# Patient Record
Sex: Male | Born: 1945 | Race: White | Hispanic: No | Smoking: Never smoker
Health system: Southern US, Community
[De-identification: ages and names within clinical notes are randomized; demographics above are authoritative.]

## PROBLEM LIST (undated history)

## (undated) DIAGNOSIS — D849 Immunodeficiency, unspecified: Secondary | ICD-10-CM

## (undated) DIAGNOSIS — M1990 Unspecified osteoarthritis, unspecified site: Secondary | ICD-10-CM

## (undated) DIAGNOSIS — S129XXA Fracture of neck, unspecified, initial encounter: Secondary | ICD-10-CM

## (undated) DIAGNOSIS — B20 Human immunodeficiency virus [HIV] disease: Secondary | ICD-10-CM

## (undated) DIAGNOSIS — Z21 Asymptomatic human immunodeficiency virus [HIV] infection status: Secondary | ICD-10-CM

## (undated) DIAGNOSIS — Z85828 Personal history of other malignant neoplasm of skin: Secondary | ICD-10-CM

## (undated) DIAGNOSIS — Z87442 Personal history of urinary calculi: Secondary | ICD-10-CM

## (undated) DIAGNOSIS — J189 Pneumonia, unspecified organism: Secondary | ICD-10-CM

## (undated) DIAGNOSIS — C4490 Unspecified malignant neoplasm of skin, unspecified: Secondary | ICD-10-CM

## (undated) DIAGNOSIS — Z801 Family history of malignant neoplasm of trachea, bronchus and lung: Secondary | ICD-10-CM

## (undated) DIAGNOSIS — I10 Essential (primary) hypertension: Secondary | ICD-10-CM

## (undated) DIAGNOSIS — C61 Malignant neoplasm of prostate: Secondary | ICD-10-CM

## (undated) DIAGNOSIS — Z800 Family history of malignant neoplasm of digestive organs: Secondary | ICD-10-CM

## (undated) DIAGNOSIS — T4145XA Adverse effect of unspecified anesthetic, initial encounter: Secondary | ICD-10-CM

## (undated) DIAGNOSIS — D649 Anemia, unspecified: Secondary | ICD-10-CM

## (undated) DIAGNOSIS — G589 Mononeuropathy, unspecified: Secondary | ICD-10-CM

## (undated) DIAGNOSIS — T8859XA Other complications of anesthesia, initial encounter: Secondary | ICD-10-CM

## (undated) DIAGNOSIS — C4491 Basal cell carcinoma of skin, unspecified: Secondary | ICD-10-CM

## (undated) HISTORY — PX: PROSTATE BIOPSY: SHX241

## (undated) HISTORY — PX: FACIAL COSMETIC SURGERY: SHX629

## (undated) HISTORY — DX: Personal history of other malignant neoplasm of skin: Z85.828

## (undated) HISTORY — DX: Family history of malignant neoplasm of trachea, bronchus and lung: Z80.1

## (undated) HISTORY — DX: Family history of malignant neoplasm of digestive organs: Z80.0

## (undated) HISTORY — PX: NOSE SURGERY: SHX723

## (undated) HISTORY — PX: CHOLECYSTECTOMY: SHX55

## (undated) HISTORY — PX: SPLENECTOMY: SUR1306

---
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
## 1979-09-04 DIAGNOSIS — K759 Inflammatory liver disease, unspecified: Secondary | ICD-10-CM

## 1979-09-04 HISTORY — DX: Inflammatory liver disease, unspecified: K75.9

## 2004-08-25 ENCOUNTER — Ambulatory Visit: Payer: Self-pay | Admitting: Infectious Diseases

## 2014-08-05 DIAGNOSIS — Z85828 Personal history of other malignant neoplasm of skin: Secondary | ICD-10-CM | POA: Insufficient documentation

## 2015-12-03 ENCOUNTER — Emergency Department (HOSPITAL_COMMUNITY): Payer: No Typology Code available for payment source

## 2015-12-03 ENCOUNTER — Encounter (HOSPITAL_COMMUNITY): Payer: Self-pay

## 2015-12-03 ENCOUNTER — Emergency Department (HOSPITAL_COMMUNITY)
Admission: EM | Admit: 2015-12-03 | Discharge: 2015-12-03 | Disposition: A | Discharge status: Home / Self Care | Payer: No Typology Code available for payment source | Attending: Emergency Medicine | Admitting: Emergency Medicine

## 2015-12-03 DIAGNOSIS — I10 Essential (primary) hypertension: Secondary | ICD-10-CM | POA: Insufficient documentation

## 2015-12-03 DIAGNOSIS — Z79899 Other long term (current) drug therapy: Secondary | ICD-10-CM | POA: Insufficient documentation

## 2015-12-03 DIAGNOSIS — Y9389 Activity, other specified: Secondary | ICD-10-CM | POA: Insufficient documentation

## 2015-12-03 DIAGNOSIS — T1490XA Injury, unspecified, initial encounter: Secondary | ICD-10-CM

## 2015-12-03 DIAGNOSIS — Y998 Other external cause status: Secondary | ICD-10-CM | POA: Diagnosis not present

## 2015-12-03 DIAGNOSIS — Y92410 Unspecified street and highway as the place of occurrence of the external cause: Secondary | ICD-10-CM | POA: Insufficient documentation

## 2015-12-03 DIAGNOSIS — B20 Human immunodeficiency virus [HIV] disease: Secondary | ICD-10-CM | POA: Insufficient documentation

## 2015-12-03 DIAGNOSIS — R10817 Generalized abdominal tenderness: Secondary | ICD-10-CM | POA: Diagnosis not present

## 2015-12-03 DIAGNOSIS — S82832A Other fracture of upper and lower end of left fibula, initial encounter for closed fracture: Secondary | ICD-10-CM

## 2015-12-03 DIAGNOSIS — S89392A Other physeal fracture of lower end of left fibula, initial encounter for closed fracture: Secondary | ICD-10-CM | POA: Diagnosis not present

## 2015-12-03 DIAGNOSIS — S8992XA Unspecified injury of left lower leg, initial encounter: Secondary | ICD-10-CM | POA: Diagnosis present

## 2015-12-03 HISTORY — DX: Immunodeficiency, unspecified: D84.9

## 2015-12-03 HISTORY — DX: Asymptomatic human immunodeficiency virus (hiv) infection status: Z21

## 2015-12-03 HISTORY — DX: Essential (primary) hypertension: I10

## 2015-12-03 HISTORY — DX: Human immunodeficiency virus (HIV) disease: B20

## 2015-12-03 LAB — PROTIME-INR
INR: 1.1 (ref 0.00–1.49)
Prothrombin Time: 14.4 seconds (ref 11.6–15.2)

## 2015-12-03 LAB — SAMPLE TO BLOOD BANK

## 2015-12-03 LAB — CBC
HEMATOCRIT: 47.1 % (ref 39.0–52.0)
Hemoglobin: 15.7 g/dL (ref 13.0–17.0)
MCH: 29.1 pg (ref 26.0–34.0)
MCHC: 33.3 g/dL (ref 30.0–36.0)
MCV: 87.2 fL (ref 78.0–100.0)
Platelets: 183 10*3/uL (ref 150–400)
RBC: 5.4 MIL/uL (ref 4.22–5.81)
RDW: 14.5 % (ref 11.5–15.5)
WBC: 8.1 10*3/uL (ref 4.0–10.5)

## 2015-12-03 LAB — I-STAT CHEM 8, ED
BUN: 21 mg/dL — AB (ref 6–20)
CREATININE: 1 mg/dL (ref 0.61–1.24)
Calcium, Ion: 1 mmol/L — ABNORMAL LOW (ref 1.13–1.30)
Chloride: 104 mmol/L (ref 101–111)
Glucose, Bld: 71 mg/dL (ref 65–99)
HEMATOCRIT: 52 % (ref 39.0–52.0)
HEMOGLOBIN: 17.7 g/dL — AB (ref 13.0–17.0)
POTASSIUM: 3.8 mmol/L (ref 3.5–5.1)
SODIUM: 143 mmol/L (ref 135–145)
TCO2: 28 mmol/L (ref 0–100)

## 2015-12-03 LAB — COMPREHENSIVE METABOLIC PANEL
ALBUMIN: 4.2 g/dL (ref 3.5–5.0)
ALT: 23 U/L (ref 17–63)
AST: 30 U/L (ref 15–41)
Alkaline Phosphatase: 95 U/L (ref 38–126)
Anion gap: 10 (ref 5–15)
BUN: 16 mg/dL (ref 6–20)
CHLORIDE: 105 mmol/L (ref 101–111)
CO2: 26 mmol/L (ref 22–32)
Calcium: 9.1 mg/dL (ref 8.9–10.3)
Creatinine, Ser: 1.2 mg/dL (ref 0.61–1.24)
GFR calc Af Amer: >60 mL/min (ref >60)
GFR calc non Af Amer: >60 mL/min (ref >60)
GLUCOSE: 78 mg/dL (ref 65–99)
POTASSIUM: 3.9 mmol/L (ref 3.5–5.1)
Sodium: 141 mmol/L (ref 135–145)
Total Bilirubin: 0.6 mg/dL (ref 0.3–1.2)
Total Protein: 7.4 g/dL (ref 6.5–8.1)

## 2015-12-03 MED ORDER — SODIUM CHLORIDE 0.9 % IV BOLUS (SEPSIS)
125.0000 mL | Freq: Once | INTRAVENOUS | Status: AC
  Administered 2015-12-03: 125 mL via INTRAVENOUS

## 2015-12-03 MED ORDER — IOPAMIDOL (ISOVUE-300) INJECTION 61%
INTRAVENOUS | Status: AC
  Administered 2015-12-03: 09:00:00
  Filled 2015-12-03: qty 100

## 2015-12-03 MED ORDER — HYDROMORPHONE HCL 1 MG/ML IJ SOLN
1.0000 mg | Freq: Once | INTRAMUSCULAR | Status: DC
  Filled 2015-12-03: qty 1

## 2015-12-03 MED ORDER — TRAMADOL HCL 50 MG PO TABS: 50.0000 mg | ORAL_TABLET | Freq: Four times a day (QID) | ORAL | Status: DC | PRN

## 2015-12-03 MED ORDER — HYDROMORPHONE HCL 1 MG/ML IJ SOLN
0.5000 mg | Freq: Once | INTRAMUSCULAR | Status: AC
  Administered 2015-12-03: 0.5 mg via INTRAVENOUS
  Filled 2015-12-03: qty 1

## 2015-12-03 NOTE — ED Notes (Signed)
Pt transported to CT ?
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
## 2015-12-03 NOTE — Progress Notes (Signed)
Orthopedic Tech Progress Note Patient Details:  Jimmy Conner 06-17-1946 [id number]  Ortho Devices Type of Ortho Device: Ace wrap, Post (short leg) splint, Crutches Ortho Device/Splint Location: lle Ortho Device/Splint Interventions: Application   Jimmy Conner 12/03/2015, 12:32 PM

## 2015-12-03 NOTE — Progress Notes (Signed)
Orthopedic Tech Progress Note Patient Details:  Jimmy Conner 07/01/1946 [id number]  Ortho Devices Type of Ortho Device: Ace wrap, Post (short leg) splint, Crutches Ortho Device/Splint Location: lle Ortho Device/Splint Interventions: Application   [name] 12/03/2015, 12:33 PM

## 2015-12-03 NOTE — ED Notes (Signed)
Pt family asking for an update, MD aware.
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
## 2015-12-03 NOTE — ED Notes (Signed)
MD at bedside. 

## 2015-12-03 NOTE — Discharge Instructions (Signed)
Ankle Fracture A fracture is a break in a bone. The ankle joint is made up of three bones. These include the lower (distal)sections of your lower leg bones, called the tibia and fibula, along with a bone in your foot, called the talus. Depending on how bad the break is and if more than one ankle joint bone is broken, a cast or splint is used to protect and keep your injured bone from moving while it heals. Sometimes, surgery is required to help the fracture heal properly.  There are two general types of fractures:  Stable fracture. This includes a single fracture line through one bone, with no injury to ankle ligaments. A fracture of the talus that does not have any displacement (movement of the bone on either side of the fracture line) is also stable.  Unstable fracture. This includes more than one fracture line through one or more bones in the ankle joint. It also includes fractures that have displacement of the bone on either side of the fracture line. CAUSES  A direct blow to the ankle.   Quickly and severely twisting your ankle.  Trauma, such as a car accident or falling from a significant height. RISK FACTORS You may be at a higher risk of ankle fracture if:  You have certain medical conditions.  You are involved in high-impact sports.  You are involved in a high-impact car accident. SIGNS AND SYMPTOMS   Tender and swollen ankle.  Bruising around the injured ankle.  Pain on movement of the ankle.  Difficulty walking or putting weight on the ankle.  A cold foot below the site of the ankle injury. This can occur if the blood vessels passing through your injured ankle were also damaged.  Numbness in the foot below the site of the ankle injury. DIAGNOSIS  An ankle fracture is usually diagnosed with a physical exam and X-rays. A CT scan may also be required for complex fractures. TREATMENT  Stable fractures are treated with a cast or splint and using crutches to avoid putting  weight on your injured ankle. This is followed by an ankle strengthening program. Some patients require a special type of cast, depending on other medical problems they may have. Unstable fractures require surgery to ensure the bones heal properly. Your health care provider will tell you what type of fracture you have and the best treatment for your condition. HOME CARE INSTRUCTIONS   Review correct crutch use with your health care provider and use your crutches as directed. Safe use of crutches is extremely important. Misuse of crutches can cause you to fall or cause injury to nerves in your hands or armpits.  Do not put weight or pressure on the injured ankle until directed by your health care provider.  To lessen the swelling, keep the injured leg elevated while sitting or lying down.  Apply ice to the injured area:  Put ice in a plastic bag.  Place a towel between your cast and the bag.  Leave the ice on for 20 minutes, 2-3 times a day.  If you have a plaster or fiberglass cast:  Do not try to scratch the skin under the cast with any objects. This can increase your risk of skin infection.  Check the skin around the cast every day. You may put lotion on any red or sore areas.  Keep your cast dry and clean.  If you have a plaster splint:  Wear the splint as directed.  You may loosen the elastic   around the splint if your toes become numb, tingle, or turn cold or blue.  Do not put pressure on any part of your cast or splint; it may break. Rest your cast only on a pillow the first 24 hours until it is fully hardened.  Your cast or splint can be protected during bathing with a plastic bag sealed to your skin with medical tape. Do not lower the cast or splint into water.  Take medicines as directed by your health care provider. Only take over-the-counter or prescription medicines for pain, discomfort, or fever as directed by your health care provider.  Do not drive a vehicle until  your health care provider specifically tells you it is safe to do so.  If your health care provider has given you a follow-up appointment, it is very important to keep that appointment. Not keeping the appointment could result in a chronic or permanent injury, pain, and disability. If you have any problem keeping the appointment, call the facility for assistance. SEEK MEDICAL CARE IF: You develop increased swelling or discomfort. SEEK IMMEDIATE MEDICAL CARE IF:   Your cast gets damaged or breaks.  You have continued severe pain.  You develop new pain or swelling after the cast was put on.  Your skin or toenails below the injury turn blue or gray.  Your skin or toenails below the injury feel cold, numb, or have loss of sensitivity to touch.  There is a bad smell or pus draining from under the cast. MAKE SURE YOU:   Understand these instructions.  Will watch your condition.  Will get help right away if you are not doing well or get worse.   This information is not intended to replace advice given to you by your health care provider. Make sure you discuss any questions you have with your health care provider.   Document Released: 08/17/2000 Document Revised: 08/25/2013 Document Reviewed: 03/19/2013 Elsevier Interactive Patient Education 2016 [employer]  

## 2018-03-03 ENCOUNTER — Encounter: Payer: Self-pay | Admitting: Emergency Medicine

## 2018-03-03 ENCOUNTER — Inpatient Hospital Stay
Admission: EM | Admit: 2018-03-03 | Discharge: 2018-03-05 | DRG: 690 | Disposition: A | Discharge status: Home / Self Care | Payer: Medicare Other | Attending: Internal Medicine | Admitting: Internal Medicine

## 2018-03-03 ENCOUNTER — Emergency Department: Payer: Medicare Other

## 2018-03-03 ENCOUNTER — Other Ambulatory Visit: Payer: Self-pay

## 2018-03-03 DIAGNOSIS — B20 Human immunodeficiency virus [HIV] disease: Secondary | ICD-10-CM | POA: Diagnosis present

## 2018-03-03 DIAGNOSIS — I10 Essential (primary) hypertension: Secondary | ICD-10-CM | POA: Diagnosis present

## 2018-03-03 DIAGNOSIS — Z9889 Other specified postprocedural states: Secondary | ICD-10-CM

## 2018-03-03 DIAGNOSIS — Z9081 Acquired absence of spleen: Secondary | ICD-10-CM

## 2018-03-03 DIAGNOSIS — Z79899 Other long term (current) drug therapy: Secondary | ICD-10-CM | POA: Diagnosis not present

## 2018-03-03 DIAGNOSIS — Z9049 Acquired absence of other specified parts of digestive tract: Secondary | ICD-10-CM

## 2018-03-03 DIAGNOSIS — N179 Acute kidney failure, unspecified: Secondary | ICD-10-CM | POA: Diagnosis present

## 2018-03-03 DIAGNOSIS — N200 Calculus of kidney: Secondary | ICD-10-CM

## 2018-03-03 DIAGNOSIS — Z960 Presence of urogenital implants: Secondary | ICD-10-CM

## 2018-03-03 DIAGNOSIS — N202 Calculus of kidney with calculus of ureter: Secondary | ICD-10-CM | POA: Diagnosis present

## 2018-03-03 DIAGNOSIS — N390 Urinary tract infection, site not specified: Secondary | ICD-10-CM | POA: Diagnosis present

## 2018-03-03 DIAGNOSIS — Z21 Asymptomatic human immunodeficiency virus [HIV] infection status: Secondary | ICD-10-CM | POA: Diagnosis present

## 2018-03-03 DIAGNOSIS — R531 Weakness: Secondary | ICD-10-CM | POA: Diagnosis present

## 2018-03-03 LAB — URINALYSIS, COMPLETE (UACMP) WITH MICROSCOPIC
BILIRUBIN URINE: NEGATIVE
GLUCOSE, UA: NEGATIVE mg/dL
Ketones, ur: NEGATIVE mg/dL
NITRITE: NEGATIVE
PH: 6 (ref 5.0–8.0)
Protein, ur: 100 mg/dL — AB
Specific Gravity, Urine: 1.021 (ref 1.005–1.030)

## 2018-03-03 LAB — CBC WITH DIFFERENTIAL/PLATELET
Basophils Absolute: 0 10*3/uL (ref 0–0.1)
Basophils Relative: 1 %
Eosinophils Absolute: 0.1 10*3/uL (ref 0–0.7)
Eosinophils Relative: 2 %
HEMATOCRIT: 33.9 % — AB (ref 40.0–52.0)
Hemoglobin: 11.6 g/dL — ABNORMAL LOW (ref 13.0–18.0)
LYMPHS PCT: 40 %
Lymphs Abs: 1.5 10*3/uL (ref 1.0–3.6)
MCH: 29.6 pg (ref 26.0–34.0)
MCHC: 34.2 g/dL (ref 32.0–36.0)
MCV: 86.4 fL (ref 80.0–100.0)
MONO ABS: 0.5 10*3/uL (ref 0.2–1.0)
MONOS PCT: 12 %
NEUTROS ABS: 1.7 10*3/uL (ref 1.4–6.5)
Neutrophils Relative %: 45 %
Platelets: 226 10*3/uL (ref 150–440)
RBC: 3.93 MIL/uL — ABNORMAL LOW (ref 4.40–5.90)
RDW: 15.6 % — AB (ref 11.5–14.5)
WBC: 3.9 10*3/uL (ref 3.8–10.6)

## 2018-03-03 LAB — COMPREHENSIVE METABOLIC PANEL
ALT: 20 U/L (ref 0–44)
ANION GAP: 6 (ref 5–15)
AST: 32 U/L (ref 15–41)
Albumin: 3.1 g/dL — ABNORMAL LOW (ref 3.5–5.0)
Alkaline Phosphatase: 84 U/L (ref 38–126)
BILIRUBIN TOTAL: 0.5 mg/dL (ref 0.3–1.2)
BUN: 16 mg/dL (ref 8–23)
CHLORIDE: 108 mmol/L (ref 98–111)
CO2: 29 mmol/L (ref 22–32)
Calcium: 8.2 mg/dL — ABNORMAL LOW (ref 8.9–10.3)
Creatinine, Ser: 1.67 mg/dL — ABNORMAL HIGH (ref 0.61–1.24)
GFR, EST AFRICAN AMERICAN: 47 mL/min — AB (ref >60)
GFR, EST NON AFRICAN AMERICAN: 40 mL/min — AB (ref >60)
Glucose, Bld: 90 mg/dL (ref 70–99)
POTASSIUM: 3.9 mmol/L (ref 3.5–5.1)
Sodium: 143 mmol/L (ref 135–145)
TOTAL PROTEIN: 6.7 g/dL (ref 6.5–8.1)

## 2018-03-03 LAB — TROPONIN I

## 2018-03-03 MED ORDER — ACETAMINOPHEN 650 MG RE SUPP: 650.0000 mg | Freq: Four times a day (QID) | RECTAL | Status: DC | PRN

## 2018-03-03 MED ORDER — SODIUM CHLORIDE 0.9 % IV SOLN: 1.0000 g | INTRAVENOUS | Status: DC

## 2018-03-03 MED ORDER — SODIUM CHLORIDE 0.9 % IV SOLN
INTRAVENOUS | Status: DC
  Administered 2018-03-04: 75 mL/h via INTRAVENOUS

## 2018-03-03 MED ORDER — ENOXAPARIN SODIUM 40 MG/0.4ML ~~LOC~~ SOLN
40.0000 mg | SUBCUTANEOUS | Status: DC
  Administered 2018-03-04 – 2018-03-05 (×2): 40 mg via SUBCUTANEOUS
  Filled 2018-03-03 (×2): qty 0.4

## 2018-03-03 MED ORDER — ACETAMINOPHEN 325 MG PO TABS: 650.0000 mg | ORAL_TABLET | Freq: Four times a day (QID) | ORAL | Status: DC | PRN

## 2018-03-03 MED ORDER — OXYCODONE HCL 5 MG PO TABS: 5.0000 mg | ORAL_TABLET | ORAL | Status: DC | PRN

## 2018-03-03 MED ORDER — ONDANSETRON HCL 4 MG PO TABS: 4.0000 mg | ORAL_TABLET | Freq: Four times a day (QID) | ORAL | Status: DC | PRN

## 2018-03-03 MED ORDER — SODIUM CHLORIDE 0.9 % IV SOLN
2.0000 g | Freq: Once | INTRAVENOUS | Status: AC
  Administered 2018-03-03: 2 g via INTRAVENOUS
  Filled 2018-03-03: qty 2

## 2018-03-03 MED ORDER — ABACAVIR-DOLUTEGRAVIR-LAMIVUD 600-50-300 MG PO TABS
1.0000 | ORAL_TABLET | Freq: Every day | ORAL | Status: DC
  Administered 2018-03-04 – 2018-03-05 (×2): 1 via ORAL
  Filled 2018-03-03 (×2): qty 1

## 2018-03-03 MED ORDER — ONDANSETRON HCL 4 MG/2ML IJ SOLN: 4.0000 mg | Freq: Four times a day (QID) | INTRAMUSCULAR | Status: DC | PRN

## 2018-03-03 NOTE — ED Triage Notes (Signed)
Pt s/p hospitalization 2 weeks ago for uti and kidney stone with stent. On home antibiotics via left picc. Pt here today for weakness x 1 week, sleeping most of the day, dry cough and concerns that the picc might be infected.

## 2018-03-03 NOTE — ED Provider Notes (Signed)
[facility] Emergency Department Provider Note    First MD Initiated Contact with Patient 03/03/18 1713     (approximate)  I have reviewed the triage vital signs and the nursing notes.   HISTORY  Chief Complaint Fatigue    HPI Jimmy Conner is a 72 y.o. male with a history of HIV, hypertension presents to the ER for chief complaint of generalized weakness increased drowsiness per family for the past week.  Patient was admitted to the [place] 2 weeks ago found to have a kidney stone and had stent placed and was sent home on antibiotics.  Patient family cannot remember what type of antibiotics they are receiving but they get antibiotics once daily.  Patient was was to have home health nurse come check on him but today was the first time that they came by and due to his drowsiness and worsening fatigue and weakness was directed to the ER.  States he still having some discomfort with urination.    Past Medical History:  Diagnosis Date  . HIV (human immunodeficiency virus infection) ([place])   . Hypertension   . Immune deficiency disorder ([place])    History reviewed. No pertinent family history. Past Surgical History:  Procedure Laterality Date  . CHOLECYSTECTOMY    . SPLENECTOMY     There are no active problems to display for this patient.     Prior to Admission medications   Medication Sig Start Date End Date Taking? Authorizing Provider  abacavir-dolutegravir-lamiVUDine (TRIUMEQ) 600-50-300 MG tablet Take 1 tablet by mouth daily.    [provider]  traMADol (ULTRAM) 50 MG tablet Take 1 tablet (50 mg total) by mouth every 6 (six) hours as needed. 12/03/15   Dorie Rank, MD    Allergies Patient has no known allergies.    Social History Social History   Tobacco Use  . Smoking status: Never Smoker  Substance Use Topics  . Alcohol use: No  . Drug use: Not on file    Review of Systems Patient denies headaches, rhinorrhea, blurry vision,  numbness, shortness of breath, chest pain, edema, cough, abdominal pain, nausea, vomiting, diarrhea, dysuria, fevers, rashes or hallucinations unless otherwise stated above in HPI. ____________________________________________   PHYSICAL EXAM:  VITAL SIGNS: Vitals:   03/03/18 1637  BP: 132/71  Pulse: 86  Temp: 97.6 F (36.4 C)  SpO2: 97%    Constitutional: Alert and oriented.  Eyes: Conjunctivae are normal.  Head: Atraumatic. Nose: No congestion/rhinnorhea. Mouth/Throat: Mucous membranes are moist.   Neck: No stridor. Painless ROM.  Cardiovascular: Normal rate, regular rhythm. Grossly normal heart sounds.  Good peripheral circulation. Respiratory: Normal respiratory effort.  No retractions. Lungs CTAB. Gastrointestinal: Soft and nontender. No distention. No abdominal bruits. No CVA tenderness. Genitourinary:  Musculoskeletal: No lower extremity tenderness nor edema.  No joint effusions. Neurologic:  Normal speech and language. No gross focal neurologic deficits are appreciated. No facial droop Skin:  Skin is warm, dry and intact. No rash noted. Psychiatric: Mood and affect are normal. Speech and behavior are normal.  ____________________________________________   LABS (all labs ordered are listed, but only abnormal results are displayed)  Results for orders placed or performed during the hospital encounter of 03/03/18 (from the past 24 hour(s))  CBC with Differential/Platelet     Status: Abnormal   Collection Time: 03/03/18  6:21 PM  Result Value Ref Range   WBC 3.9 3.8 - 10.6 K/uL   RBC 3.93 (L) 4.40 - 5.90 MIL/uL   Hemoglobin  11.6 (L) 13.0 - 18.0 g/dL   HCT 33.9 (L) 40.0 - 52.0 %   MCV 86.4 80.0 - 100.0 fL   MCH 29.6 26.0 - 34.0 pg   MCHC 34.2 32.0 - 36.0 g/dL   RDW 15.6 (H) 11.5 - 14.5 %   Platelets 226 150 - 440 K/uL   Neutrophils Relative % 45 %   Neutro Abs 1.7 1.4 - 6.5 K/uL   Lymphocytes Relative 40 %   Lymphs Abs 1.5 1.0 - 3.6 K/uL   Monocytes Relative 12  %   Monocytes Absolute 0.5 0.2 - 1.0 K/uL   Eosinophils Relative 2 %   Eosinophils Absolute 0.1 0 - 0.7 K/uL   Basophils Relative 1 %   Basophils Absolute 0.0 0 - 0.1 K/uL  Comprehensive metabolic panel     Status: Abnormal   Collection Time: 03/03/18  6:21 PM  Result Value Ref Range   Sodium 143 135 - 145 mmol/L   Potassium 3.9 3.5 - 5.1 mmol/L   Chloride 108 98 - 111 mmol/L   CO2 29 22 - 32 mmol/L   Glucose, Bld 90 70 - 99 mg/dL   BUN 16 8 - 23 mg/dL   Creatinine, Ser 1.67 (H) 0.61 - 1.24 mg/dL   Calcium 8.2 (L) 8.9 - 10.3 mg/dL   Total Protein 6.7 6.5 - 8.1 g/dL   Albumin 3.1 (L) 3.5 - 5.0 g/dL   AST 32 15 - 41 U/L   ALT 20 0 - 44 U/L   Alkaline Phosphatase 84 38 - 126 U/L   Total Bilirubin 0.5 0.3 - 1.2 mg/dL   GFR calc non Af Amer 40 (L) >60 mL/min   GFR calc Af Amer 47 (L) >60 mL/min   Anion gap 6 5 - 15  Urinalysis, Complete w Microscopic     Status: Abnormal   Collection Time: 03/03/18  6:21 PM  Result Value Ref Range   Color, Urine YELLOW (A) YELLOW   APPearance HAZY (A) CLEAR   Specific Gravity, Urine 1.021 1.005 - 1.030   pH 6.0 5.0 - 8.0   Glucose, UA NEGATIVE NEGATIVE mg/dL   Hgb urine dipstick MODERATE (A) NEGATIVE   Bilirubin Urine NEGATIVE NEGATIVE   Ketones, ur NEGATIVE NEGATIVE mg/dL   Protein, ur 100 (A) NEGATIVE mg/dL   Nitrite NEGATIVE NEGATIVE   Leukocytes, UA MODERATE (A) NEGATIVE   RBC / HPF >50 (H) 0 - 5 RBC/hpf   WBC, UA >50 (H) 0 - 5 WBC/hpf   Bacteria, UA FEW (A) NONE SEEN   Squamous Epithelial / LPF 0-5 0 - 5   Mucus PRESENT    Hyaline Casts, UA PRESENT    Crystals PRESENT (A) NEGATIVE  Troponin I     Status: None   Collection Time: 03/03/18  6:21 PM  Result Value Ref Range   Troponin I <0.03 <0.03 ng/mL   ____________________________________________  EKG My review and personal interpretation at Time: 18:44   Indication: weakness  Rate: 65  Rhythm: normal Axis: normal Other: no stemi, normal  intervals ____________________________________________  RADIOLOGY   ____________________________________________   PROCEDURES  Procedure(s) performed:  Procedures    Critical Care performed: no ____________________________________________   INITIAL IMPRESSION / ASSESSMENT AND PLAN / ED COURSE  Pertinent labs & imaging results that were available during my care of the patient were reviewed by me and considered in my medical decision making (see chart for details).   DDX: uti, dehydration, electrolyte abn, anemia, sepsis  Jimmy Conner is  a 72 y.o. who presents to the ED with symptoms as described above.  Patient is AFVSS in ED. Exam as above. Given current presentation have considered the above differential.  Frail appearing, blood work is reasuring but does have persistent uti and given his complicated course with recent hospitalization, do feel patient would benefit from more broad-spectrum antibiotics.  On for some unable to obtain therapeutic history or data from the [facility] and family is unsure of what antibiotic she is on at this time.  Based on his weakness however and failing outpatient management do feel patient would benefit from broad-spectrum antibiotics.      As part of my medical decision making, I reviewed the following data within the [place] notes reviewed and incorporated, Labs reviewed, notes from prior ED visits and [place] Controlled Substance Database   ____________________________________________   FINAL CLINICAL IMPRESSION(S) / ED DIAGNOSES  Final diagnoses:  Lower urinary tract infectious disease      NEW MEDICATIONS STARTED DURING THIS VISIT:  New Prescriptions   No medications on file     Note:  This document was prepared using Dragon voice recognition software and may include unintentional dictation errors.    Merlyn Lot, MD 03/03/18 2059

## 2018-03-03 NOTE — H&P (Signed)
[facility] [place] NAME: Jimmy Conner    MR#:  [id number]  DATE OF BIRTH:  [birth year]  DATE OF ADMISSION:  03/03/2018  PRIMARY CARE PHYSICIAN: [facility]   REQUESTING/REFERRING PHYSICIAN: Clearnce Hasten, MD  CHIEF COMPLAINT:   Chief Complaint  Patient presents with  . Fatigue    HISTORY OF PRESENT ILLNESS:  Jimmy Conner  is a 72 y.o. male who presents with 1 week of weakness, malaise.  Patient states that he had a ureteral stent placed recently as part of treatment for a large kidney stone.  He states that the stone has not yet passed.  He states that the plan was to proceed with a procedure to break up the stone.  He is seen at the [place].  He was to be transferred to the [place], but they are on diversion.  Hospitalist were called for admission  PAST MEDICAL HISTORY:   Past Medical History:  Diagnosis Date  . HIV (human immunodeficiency virus infection) ([place])   . Hypertension   . Immune deficiency disorder ([place])      PAST SURGICAL HISTORY:   Past Surgical History:  Procedure Laterality Date  . CHOLECYSTECTOMY    . SPLENECTOMY       SOCIAL HISTORY:   Social History   Tobacco Use  . Smoking status: Never Smoker  Substance Use Topics  . Alcohol use: No     FAMILY HISTORY:  Family history reviewed and is noncontributory   DRUG ALLERGIES:  No Known Allergies  MEDICATIONS AT HOME:   Prior to Admission medications   Medication Sig Start Date End Date Taking? Authorizing Provider  abacavir-dolutegravir-lamiVUDine (TRIUMEQ) 600-50-300 MG tablet Take 1 tablet by mouth daily.   Yes [provider]  traMADol (ULTRAM) 50 MG tablet Take 1 tablet (50 mg total) by mouth every 6 (six) hours as needed. Patient not taking: Reported on 03/03/2018 12/03/15   Dorie Rank, MD    REVIEW OF SYSTEMS:  Review of Systems  Constitutional: Positive for malaise/fatigue. Negative for chills, fever and weight loss.   HENT: Negative for ear pain, hearing loss and tinnitus.   Eyes: Negative for blurred vision, double vision, pain and redness.  Respiratory: Negative for cough, hemoptysis and shortness of breath.   Cardiovascular: Negative for chest pain, palpitations, orthopnea and leg swelling.  Gastrointestinal: Negative for abdominal pain, constipation, diarrhea, nausea and vomiting.  Genitourinary: Negative for dysuria, frequency and hematuria.  Musculoskeletal: Negative for back pain, joint pain and neck pain.  Skin:       No acne, rash, or lesions  Neurological: Positive for weakness. Negative for dizziness, tremors and focal weakness.  Endo/Heme/Allergies: Negative for polydipsia. Does not bruise/bleed easily.  Psychiatric/Behavioral: Negative for depression. The patient is not nervous/anxious and does not have insomnia.      VITAL SIGNS:   Vitals:   03/03/18 1830 03/03/18 2030 03/03/18 2100 03/03/18 2130  BP: 117/75 120/81 140/80 120/80  Pulse: 65 67    Resp:  20 18 16   Temp:      TempSrc:      SpO2: 97% 98%    Weight:      Height:       Wt Readings from Last 3 Encounters:  03/03/18 73.5 kg (162 lb)    PHYSICAL EXAMINATION:  Physical Exam  Vitals reviewed. Constitutional: He is oriented to person, place, and time. He appears well-developed and well-nourished. No distress.  HENT:  Head: Normocephalic and atraumatic.  Mouth/Throat: Oropharynx is clear and moist.  Eyes: Pupils are equal, round, and reactive to light. Conjunctivae and EOM are normal. No scleral icterus.  Neck: Normal range of motion. Neck supple. No JVD present. No thyromegaly present.  Cardiovascular: Normal rate, regular rhythm and intact distal pulses. Exam reveals no gallop and no friction rub.  No murmur heard. Respiratory: Effort normal and breath sounds normal. No respiratory distress. He has no wheezes. He has no rales.  GI: Soft. Bowel sounds are normal. He exhibits no distension. There is no tenderness.   Musculoskeletal: Normal range of motion. He exhibits no edema.  No arthritis, no gout  Lymphadenopathy:    He has no cervical adenopathy.  Neurological: He is alert and oriented to person, place, and time. No cranial nerve deficit.  No dysarthria, no aphasia  Skin: Skin is warm and dry. No rash noted. No erythema.  Psychiatric: He has a normal mood and affect. His behavior is normal. Judgment and thought content normal.    LABORATORY PANEL:   CBC Recent Labs  Lab 03/03/18 1821  WBC 3.9  HGB 11.6*  HCT 33.9*  PLT 226   ------------------------------------------------------------------------------------------------------------------  Chemistries  Recent Labs  Lab 03/03/18 1821  NA 143  K 3.9  CL 108  CO2 29  GLUCOSE 90  BUN 16  CREATININE 1.67*  CALCIUM 8.2*  AST 32  ALT 20  ALKPHOS 84  BILITOT 0.5   ------------------------------------------------------------------------------------------------------------------  Cardiac Enzymes Recent Labs  Lab 03/03/18 1821  TROPONINI <0.03   ------------------------------------------------------------------------------------------------------------------  RADIOLOGY:  Dg Chest Portable 1 View  Result Date: 03/03/2018 CLINICAL DATA:  Weakness for 1 week, somnolence. EXAM: PORTABLE CHEST 1 VIEW COMPARISON:  12/03/2015. FINDINGS: Mild cardiac enlargement. No consolidation or edema. No effusion or pneumothorax. Bones unremarkable. PICC line tip mid SVC. Similar appearance to priors. IMPRESSION: Mild cardiac enlargement. No active disease. PICC line tip mid SVC. Electronically Signed   By: Staci Righter M.D.   On: 03/03/2018 18:14    EKG:   Orders placed or performed during the hospital encounter of 03/03/18  . ED EKG  . ED EKG  . EKG 12-Lead  . EKG 12-Lead    IMPRESSION AND PLAN:  Principal Problem:   UTI (urinary tract infection) -IV antibiotics started, patient states that he had stent placed but has not yet had  definitive treatment for his kidney stone.  He is typically seen at the [place].  We will get a urology consult Active Problems:   AKI (acute kidney injury) ([place]) -IV fluids, avoid nephrotoxins   Status post placement of ureteral stent -Per patient this is part of ongoing kidney stone treatment, urology consult as above   HIV (human immunodeficiency virus infection) ([place]) -continue HAART medicine   HTN (hypertension) -continue home meds  Chart review performed and case discussed with ED provider. Labs, imaging and/or ECG reviewed by provider and discussed with patient/family. Management plans discussed with the patient and/or family.  DVT PROPHYLAXIS: SubQ lovenox  GI PROPHYLAXIS: None  ADMISSION STATUS: Inpatient  CODE STATUS: Full  TOTAL TIME TAKING CARE OF THIS PATIENT: 40 minutes.   Jimmy Conner 03/03/2018, 10:29 PM  [facility]  Office  [phone]  CC: Primary care physician; [facility]  Note:  This document was prepared using Dragon voice recognition software and may include unintentional dictation errors.

## 2018-03-04 LAB — CBC
HEMATOCRIT: 32.5 % — AB (ref 40.0–52.0)
Hemoglobin: 11 g/dL — ABNORMAL LOW (ref 13.0–18.0)
MCH: 29.3 pg (ref 26.0–34.0)
MCHC: 33.8 g/dL (ref 32.0–36.0)
MCV: 86.7 fL (ref 80.0–100.0)
PLATELETS: 205 10*3/uL (ref 150–440)
RBC: 3.75 MIL/uL — ABNORMAL LOW (ref 4.40–5.90)
RDW: 16.1 % — AB (ref 11.5–14.5)
WBC: 3.7 10*3/uL — ABNORMAL LOW (ref 3.8–10.6)

## 2018-03-04 LAB — BASIC METABOLIC PANEL
Anion gap: 6 (ref 5–15)
BUN: 18 mg/dL (ref 8–23)
CO2: 29 mmol/L (ref 22–32)
CREATININE: 1.62 mg/dL — AB (ref 0.61–1.24)
Calcium: 8.1 mg/dL — ABNORMAL LOW (ref 8.9–10.3)
Chloride: 108 mmol/L (ref 98–111)
GFR calc Af Amer: 48 mL/min — ABNORMAL LOW (ref >60)
GFR, EST NON AFRICAN AMERICAN: 42 mL/min — AB (ref >60)
GLUCOSE: 81 mg/dL (ref 70–99)
POTASSIUM: 4 mmol/L (ref 3.5–5.1)
Sodium: 143 mmol/L (ref 135–145)

## 2018-03-04 MED ORDER — SODIUM CHLORIDE 0.9 % IV SOLN
2.0000 g | INTRAVENOUS | Status: DC
  Administered 2018-03-04: 2 g via INTRAVENOUS
  Filled 2018-03-04: qty 2

## 2018-03-04 NOTE — Progress Notes (Signed)
[facility] [facility] [place] NAME: Jimmy Conner    MR#:  [id number]  DATE OF BIRTH:  1946/06/17  SUBJECTIVE:  CHIEF COMPLAINT:   Chief Complaint  Patient presents with  . Fatigue   The patient has no complaints. REVIEW OF SYSTEMS:  Review of Systems  Constitutional: Negative for chills, fever and malaise/fatigue.  HENT: Negative for sore throat.   Eyes: Negative for blurred vision and double vision.  Respiratory: Negative for cough, hemoptysis, shortness of breath, wheezing and stridor.   Cardiovascular: Negative for chest pain, palpitations, orthopnea and leg swelling.  Gastrointestinal: Negative for abdominal pain, blood in stool, diarrhea, melena, nausea and vomiting.  Genitourinary: Negative for dysuria, flank pain and hematuria.  Musculoskeletal: Negative for back pain and joint pain.  Neurological: Negative for dizziness, sensory change, focal weakness, seizures, loss of consciousness, weakness and headaches.  Endo/Heme/Allergies: Negative for polydipsia.  Psychiatric/Behavioral: Negative for depression. The patient is not nervous/anxious.     DRUG ALLERGIES:  No Known Allergies VITALS:  Blood pressure 112/62, pulse (!) 59, temperature 98 F (36.7 C), temperature source Oral, resp. rate 18, height 5\' 8"  (1.727 m), weight 141 lb 14.4 oz (64.4 kg), SpO2 98 %. PHYSICAL EXAMINATION:  Physical Exam  Constitutional: He is oriented to person, place, and time. He appears well-developed.  HENT:  Head: Normocephalic.  Mouth/Throat: Oropharynx is clear and moist.  Eyes: Pupils are equal, round, and reactive to light. Conjunctivae and EOM are normal. No scleral icterus.  Neck: Normal range of motion. Neck supple. No JVD present. No tracheal deviation present.  Cardiovascular: Normal rate, regular rhythm and normal heart sounds. Exam reveals no gallop.  No murmur heard. Pulmonary/Chest: Effort normal and breath sounds normal. No respiratory  distress. He has no wheezes. He has no rales.  Abdominal: Soft. Bowel sounds are normal. He exhibits no distension. There is no tenderness. There is no rebound.  Musculoskeletal: Normal range of motion. He exhibits no edema or tenderness.  Neurological: He is alert and oriented to person, place, and time. No cranial nerve deficit.  Skin: No rash noted. No erythema.  Psychiatric: He has a normal mood and affect.   LABORATORY PANEL:  Male CBC Recent Labs  Lab 03/04/18 0446  WBC 3.7*  HGB 11.0*  HCT 32.5*  PLT 205   ------------------------------------------------------------------------------------------------------------------ Chemistries  Recent Labs  Lab 03/03/18 1821 03/04/18 0446  NA 143 143  K 3.9 4.0  CL 108 108  CO2 29 29  GLUCOSE 90 81  BUN 16 18  CREATININE 1.67* 1.62*  CALCIUM 8.2* 8.1*  AST 32  --   ALT 20  --   ALKPHOS 84  --   BILITOT 0.5  --    RADIOLOGY:  Dg Chest Portable 1 View  Result Date: 03/03/2018 CLINICAL DATA:  Weakness for 1 week, somnolence. EXAM: PORTABLE CHEST 1 VIEW COMPARISON:  12/03/2015. FINDINGS: Mild cardiac enlargement. No consolidation or edema. No effusion or pneumothorax. Bones unremarkable. PICC line tip mid SVC. Similar appearance to priors. IMPRESSION: Mild cardiac enlargement. No active disease. PICC line tip mid SVC. Electronically Signed   By: Staci Righter M.D.   On: 03/03/2018 18:14   ASSESSMENT AND PLAN:   UTI (urinary tract infection) s/p ureteral stent due to kidney stone.  Continue cefepime, follow-up urine culture and urology consult    AKI (acute kidney injury).  Continue IV fluids, avoid nephrotoxins   HIV (human immunodeficiency virus infection) ([place]) -continue HAART medicine  HTN (hypertension) -continue home meds  All the records are reviewed and case discussed with Care Management/Social Worker. Management plans discussed with the patient, family and they are in agreement.  CODE STATUS: Full Code  TOTAL  TIME TAKING CARE OF THIS PATIENT: 26 minutes.   More than 50% of the time was spent in counseling/coordination of care: YES  POSSIBLE D/C IN 1-2 DAYS, DEPENDING ON CLINICAL CONDITION.   Demetrios Loll M.D on 03/04/2018 at 5:07 PM  Between 7am to 6pm - Pager - [phone]  After 6pm go to [email or web address] - [facility]

## 2018-03-04 NOTE — Care Management Note (Addendum)
Case Management Note  Patient Details  Name: OVIDE DUSEK MRN: [id number] Date of Birth: 1946/04/27  Subjective/Objective:                   RNCM met with patient to offer transfer option to [facility] hospital and he agreed to go to [place].  He states that he is typically independent from home however he is increasing weaker since kidney stone and stent placement. Action/Plan: RNCM has faxed request to transfer to [facility] to [facility] for assistance with transfer as this is [facility]'s reporting [place] attempted to contact [place] with [facility] to discuss but there was not answer and voicemail was not on- RNCM will try again.  Update 03/04/18 1330: RNCM spoke with Elberta Leatherwood at [facility] regarding transfer to [facility]. She is checking with them on bed status. [place] is on diversion.   Expected Discharge Date:                  Expected Discharge Plan:     In-House Referral:  PCP / [facility]  Discharge planning Services  CM Consult  Post Acute Care Choice:    Choice offered to:  Patient  DME Arranged:    DME Agency:     HH Arranged:    [place] Agency:     Status of Service:  In process, will continue to follow  If discussed at Long Length of Stay Meetings, dates discussed:    Additional Comments:  Marshell Garfinkel, RN 03/04/2018, 9:39 AM

## 2018-03-04 NOTE — Progress Notes (Signed)
Pharmacy Antibiotic Note  Jimmy Conner is a 72 y.o. male admitted on 03/03/2018 with UTI.  Pharmacy has been consulted for cefepime dosing.  Plan: Will start cefepime 2g IV daily  Height: 5\' 8"  (172.7 cm) Weight: 141 lb 14.4 oz (64.4 kg) IBW/kg (Calculated) : 68.4  Temp (24hrs), Avg:97.6 F (36.4 C), Min:97.5 F (36.4 C), Max:97.6 F (36.4 C)  Recent Labs  Lab 03/03/18 1821  WBC 3.9  CREATININE 1.67*    Estimated Creatinine Clearance: 38 mL/min (A) (by C-G formula based on SCr of 1.67 mg/dL (H)).    No Known Allergies   Thank you for allowing pharmacy to be a part of this patient's care.  Tobie Lords, PharmD, BCPS Clinical Pharmacist 03/04/2018

## 2018-03-04 NOTE — Care Management (Signed)
RNCM updated patient that [facility] clinic refers to [facility] and that they do have beds. Patient states MD just came in and said he could go home and follow up as outpatient.  He does not want to go to [facility] as he states it is a very old hospital.  He is aware that [facility] is on bed diversion.  He hopes to go home and follow up with [facility] as outpatient.

## 2018-03-04 NOTE — Care Management (Signed)
Results for Jimmy Conner, Jimmy Conner (MRN [id number]) as of 03/04/2018 09:27  Ref. Range 03/04/2018 63:81  BASIC METABOLIC PANEL Unknown Rpt (A)  Sodium Latest Ref Range: 135 - 145 mmol/L 143  Potassium Latest Ref Range: 3.5 - 5.1 mmol/L 4.0  Chloride Latest Ref Range: 98 - 111 mmol/L 108  CO2 Latest Ref Range: 22 - 32 mmol/L 29  Glucose Latest Ref Range: 70 - 99 mg/dL 81  BUN Latest Ref Range: 8 - 23 mg/dL 18  Creatinine Latest Ref Range: 0.61 - 1.24 mg/dL 1.62 (H)  Calcium Latest Ref Range: 8.9 - 10.3 mg/dL 8.1 (L)  Anion gap Latest Ref Range: 5 - 15  6  GFR, Est Non African American Latest Ref Range: >60 mL/min 42 (L)  GFR, Est African American Latest Ref Range: >60 mL/min 48 (L)  WBC Latest Ref Range: 3.8 - 10.6 K/uL 3.7 (L)  RBC Latest Ref Range: 4.40 - 5.90 MIL/uL 3.75 (L)  Hemoglobin Latest Ref Range: 13.0 - 18.0 g/dL 11.0 (L)  HCT Latest Ref Range: 40.0 - 52.0 % 32.5 (L)  MCV Latest Ref Range: 80.0 - 100.0 fL 86.7  MCH Latest Ref Range: 26.0 - 34.0 pg 29.3  MCHC Latest Ref Range: 32.0 - 36.0 g/dL 33.8  RDW Latest Ref Range: 11.5 - 14.5 % 16.1 (H)  Platelets Latest Ref Range: 150 - 440 K/uL 205

## 2018-03-05 ENCOUNTER — Inpatient Hospital Stay: Payer: Medicare Other

## 2018-03-05 DIAGNOSIS — N390 Urinary tract infection, site not specified: Secondary | ICD-10-CM

## 2018-03-05 DIAGNOSIS — N179 Acute kidney failure, unspecified: Secondary | ICD-10-CM

## 2018-03-05 LAB — URINE CULTURE: CULTURE: NO GROWTH

## 2018-03-05 LAB — BASIC METABOLIC PANEL
Anion gap: 4 — ABNORMAL LOW (ref 5–15)
BUN: 19 mg/dL (ref 8–23)
CALCIUM: 8.5 mg/dL — AB (ref 8.9–10.3)
CO2: 28 mmol/L (ref 22–32)
CREATININE: 1.22 mg/dL (ref 0.61–1.24)
Chloride: 109 mmol/L (ref 98–111)
GFR calc Af Amer: >60 mL/min (ref >60)
GFR calc non Af Amer: 59 mL/min — ABNORMAL LOW (ref >60)
GLUCOSE: 79 mg/dL (ref 70–99)
Potassium: 4.1 mmol/L (ref 3.5–5.1)
Sodium: 141 mmol/L (ref 135–145)

## 2018-03-05 MED ORDER — CEPHALEXIN 500 MG PO CAPS: 500.0000 mg | ORAL_CAPSULE | Freq: Two times a day (BID) | ORAL | 0 refills | Status: DC

## 2018-03-05 MED ORDER — CEPHALEXIN 500 MG PO CAPS
500.0000 mg | ORAL_CAPSULE | Freq: Two times a day (BID) | ORAL | Status: DC
  Administered 2018-03-05: 500 mg via ORAL
  Filled 2018-03-05: qty 1

## 2018-03-05 NOTE — Progress Notes (Signed)
Received call from daughter in law asking about plan for antibiotics. Informed her that urologist had come by and requested an xray which had been done. We were waiting on results. Notified her that PICC line had been removed per Dr. Bridgett Larsson and patient would be sent home on oral antibiotics. She notified me that she was working and would be unavailable to pick patient up until after 5pm.

## 2018-03-05 NOTE — Progress Notes (Signed)
Notified of discharge by Dr. Bridgett Larsson. I made him aware that patient had PICC line from home he was to receive IV antibiotics after discharge from [place] facility. Patient was unaware of antibiotics he was on. Dr. Bridgett Larsson asked that I find out what antibiotics patient was on because he felt that patient did not require IV antibiotics at this time.

## 2018-03-05 NOTE — Progress Notes (Signed)
Discharge instructions reviewed with patient. Patient denies questions at this time. Prescription given to patient. Awaiting transportation from daughter-in-law at this time.

## 2018-03-05 NOTE — Discharge Summary (Signed)
[facility] [place] NAME: Jimmy Conner    MR#:  [id number]  DATE OF BIRTH:  [birth year]  DATE OF ADMISSION:  03/03/2018   ADMITTING PHYSICIAN: Lance Coon, MD  DATE OF DISCHARGE: 03/05/2018 PRIMARY CARE PHYSICIAN: [facility]   ADMISSION DIAGNOSIS:  Lower urinary tract infectious disease [N39.0] DISCHARGE DIAGNOSIS:  Principal Problem:   UTI (urinary tract infection) Active Problems:   Status post placement of ureteral stent   HIV (human immunodeficiency virus infection) ([place])   HTN (hypertension)   AKI (acute kidney injury) ([place])  SECONDARY DIAGNOSIS:   Past Medical History:  Diagnosis Date  . HIV (human immunodeficiency virus infection) ([place])   . Hypertension   . Immune deficiency disorder [facility])    HOSPITAL COURSE:   UTI (urinary tract infection) s/p ureteral stent due to kidney stone.   The patient has no complaints. He was treated with cefepime, changed to Keflex twice daily, follow[facility].  AKI (acute kidney injury).    Improving with IV fluids, avoid nephrotoxins HIV (human immunodeficiency virus infection) ([place]) -continue HAART medicine HTN (hypertension) -continue home meds Weakness.  Resume home health and PT. DISCHARGE CONDITIONS:  Stable, discharge to home today.  Resume home health and PT. CONSULTS OBTAINED:  Treatment Team:  Ceasar Mons, MD DRUG ALLERGIES:  No Known Allergies DISCHARGE MEDICATIONS:   Allergies as of 03/05/2018   No Known Allergies     Medication List    TAKE these medications   cephALEXin 500 MG capsule Commonly known as:  KEFLEX Take 1 capsule (500 mg total) by mouth every 12 (twelve) hours.   traMADol 50 MG tablet Commonly known as:  ULTRAM Take 1 tablet (50 mg total) by mouth every 6 (six) hours as needed.   TRIUMEQ 600-50-300 MG tablet Generic drug:  abacavir-dolutegravir-lamiVUDine Take 1 tablet by mouth  daily.        DISCHARGE INSTRUCTIONS:  See AVS. If you experience worsening of your admission symptoms, develop shortness of breath, life threatening emergency, suicidal or homicidal thoughts you must seek medical attention immediately by calling 911 or calling your MD immediately  if symptoms less severe.  You Must read complete instructions/literature along with all the possible adverse reactions/side effects for all the Medicines you take and that have been prescribed to you. Take any new Medicines after you have completely understood and accpet all the possible adverse reactions/side effects.   Please note  You were cared for by a hospitalist during your hospital stay. If you have any questions about your discharge medications or the care you received while you were in the hospital after you are discharged, you can call the unit and asked to speak with the hospitalist on call if the hospitalist that took care of you is not available. Once you are discharged, your primary care physician will handle any further medical issues. Please note that NO REFILLS for any discharge medications will be authorized once you are discharged, as it is imperative that you return to your primary care physician (or establish a relationship with a primary care physician if you do not have one) for your aftercare needs so that they can reassess your need for medications and monitor your lab values.    On the day of Discharge:  VITAL SIGNS:  Blood pressure (!) 148/77, pulse 63, temperature 97.8 F (36.6 C), temperature source Oral, resp. rate 19, height 5\' 8"  (1.727 m), weight 141  lb 14.4 oz (64.4 kg), SpO2 99 %. PHYSICAL EXAMINATION:  GENERAL:  72 y.o.-year-old patient lying in the bed with no acute distress.  EYES: Pupils equal, round, reactive to light and accommodation. No scleral icterus. Extraocular muscles intact.  HEENT: Head atraumatic, normocephalic. Oropharynx and nasopharynx clear.  NECK:  Supple,  no jugular venous distention. No thyroid enlargement, no tenderness.  LUNGS: Normal breath sounds bilaterally, no wheezing, rales,rhonchi or crepitation. No use of accessory muscles of respiration.  CARDIOVASCULAR: S1, S2 normal. No murmurs, rubs, or gallops.  ABDOMEN: Soft, non-tender, non-distended. Bowel sounds present. No organomegaly or mass.  EXTREMITIES: No pedal edema, cyanosis, or clubbing.  NEUROLOGIC: Cranial nerves II through XII are intact. Muscle strength 4/5 in all extremities. Sensation intact. Gait not checked.  PSYCHIATRIC: The patient is alert and oriented x 3.  SKIN: No obvious rash, lesion, or ulcer.  DATA REVIEW:   CBC Recent Labs  Lab 03/04/18 0446  WBC 3.7*  HGB 11.0*  HCT 32.5*  PLT 205    Chemistries  Recent Labs  Lab 03/03/18 1821  03/05/18 0505  NA 143   < > 141  K 3.9   < > 4.1  CL 108   < > 109  CO2 29   < > 28  GLUCOSE 90   < > 79  BUN 16   < > 19  CREATININE 1.67*   < > 1.22  CALCIUM 8.2*   < > 8.5*  AST 32  --   --   ALT 20  --   --   ALKPHOS 84  --   --   BILITOT 0.5  --   --    < > = values in this interval not displayed.     Microbiology Results  No results found for this or any previous visit.  RADIOLOGY:  No results found.   Management plans discussed with the patient, family and they are in agreement.  CODE STATUS: Full Code   TOTAL TIME TAKING CARE OF THIS PATIENT: 32 minutes.    Demetrios Loll M.D on 03/05/2018 at 1:48 PM  Between 7am to 6pm - Pager - [phone]  After 6pm go to [email or web address] - Proofreader  [facility]  Office  [phone]  CC: Primary care physician; [facility]   Note: This dictation was prepared with Dragon dictation along with smaller phrase technology. Any transcriptional errors that result from this process are unintentional.

## 2018-03-05 NOTE — Care Management (Signed)
Discharge faxed to [facility].

## 2018-03-05 NOTE — Progress Notes (Signed)
Spoke with daughter in law shelby related to discharge. Communicated with her discharge instructions and script for oral antibiotic. Also communicated what urology MD told patient. Wilburn Cornelia states she will be unable to get here until after 5pm.

## 2018-03-05 NOTE — Progress Notes (Signed)
Attempted to reach daughter in law x 2. Patient finally called to let me know that he spoke with his daughter in law and there were no antibiotics at home. I notified Dr. Bridgett Larsson of this and that urologist that was consulted would be by to see the patient before he left.

## 2018-03-05 NOTE — Consult Note (Signed)
Urology Consult   Physician requesting consult: Demetrios Loll, MD  Reason for consult: UTI, hx of left sided kidney stone  History of Present Illness: Jimmy Conner is a 72 y.o. male s/p left JJ stent at the [place] due to an obstructing left UPJ stone on 02/25/18.  He is currently being treated for a general malaise, AKI and a suspected UTI which has been empirically treated with cefipime.  Urine culture from 03/03/18 showed no growth.  Currently, the patient is resting comfortably in bed and denies flank pain, N/V/F/C dysuria or hematuria.  From a urinary standpoint, he states that he FOS fluctuates, but he feels like he is adequately emptying his bladder (not currently on any BPH medications). KUB from this afternoon shows that his left sided stent is in good position.  Past Medical History:  Diagnosis Date  . HIV (human immunodeficiency virus infection) ([place])   . Hypertension   . Immune deficiency disorder [facility])     Past Surgical History:  Procedure Laterality Date  . CHOLECYSTECTOMY    . SPLENECTOMY      Current Hospital Medications:  Home Meds:  Current Meds  Medication Sig  . abacavir-dolutegravir-lamiVUDine (TRIUMEQ) 601-09-323 MG tablet Take 1 tablet by mouth daily.  Marland Kitchen amLODipine (NORVASC) 5 MG tablet Take 5 mg by mouth daily.  Marland Kitchen sulfamethoxazole-trimethoprim (BACTRIM DS,SEPTRA DS) 800-160 MG tablet Take 1 tablet by mouth every Monday, Wednesday, and Friday.  . Tenofovir Alafenamide Fumarate 25 MG TABS Take 1 tablet by mouth daily.    Scheduled Meds: . abacavir-dolutegravir-lamiVUDine  1 tablet Oral Daily  . cephALEXin  500 mg Oral Q12H  . enoxaparin (LOVENOX) injection  40 mg Subcutaneous Q24H   Continuous Infusions: PRN Meds:.acetaminophen **OR** acetaminophen, ondansetron **OR** ondansetron (ZOFRAN) IV, oxyCODONE  Allergies: No Known Allergies  History reviewed. No pertinent family history.  Social History:  reports that he has never smoked. He does not have any smokeless  tobacco history on file. He reports that he does not drink alcohol. His drug history is not on file.  ROS: A complete review of systems was performed.  All systems are negative except for pertinent findings as noted.  Physical Exam:  Vital signs in last 24 hours: Temp:  [97.8 F (36.6 C)-98.2 F (36.8 C)] 97.8 F (36.6 C) (07/03 0700) Pulse Rate:  [61-63] 63 (07/03 0700) Resp:  [19] 19 (07/02 2339) BP: (135-148)/(74-77) 148/77 (07/03 0700) SpO2:  [98 %-100 %] 99 % (07/03 0700) Constitutional:  Alert and oriented, No acute distress Cardiovascular: Regular rate and rhythm, No JVD Respiratory: Normal respiratory effort, Lungs clear bilaterally GI: Abdomen is soft, nontender, nondistended, no abdominal masses GU: No CVA tenderness Lymphatic: No lymphadenopathy Neurologic: Grossly intact, no focal deficits Psychiatric: Normal mood and affect  Laboratory Data:  Recent Labs    03/03/18 1821 03/04/18 0446  WBC 3.9 3.7*  HGB 11.6* 11.0*  HCT 33.9* 32.5*  PLT 226 205    Recent Labs    03/03/18 1821 03/04/18 0446 03/05/18 0505  NA 143 143 141  K 3.9 4.0 4.1  CL 108 108 109  GLUCOSE 90 81 79  BUN 16 18 19   CALCIUM 8.2* 8.1* 8.5*  CREATININE 1.67* 1.62* 1.22     Results for orders placed or performed during the hospital encounter of 03/03/18 (from the past 24 hour(s))  Basic metabolic panel     Status: Abnormal   Collection Time: 03/05/18  5:05 AM  Result Value Ref Range   Sodium 141 135 -  145 mmol/L   Potassium 4.1 3.5 - 5.1 mmol/L   Chloride 109 98 - 111 mmol/L   CO2 28 22 - 32 mmol/L   Glucose, Bld 79 70 - 99 mg/dL   BUN 19 8 - 23 mg/dL   Creatinine, Ser 1.22 0.61 - 1.24 mg/dL   Calcium 8.5 (L) 8.9 - 10.3 mg/dL   GFR calc non Af Amer 59 (L) >60 mL/min   GFR calc Af Amer >60 >60 mL/min   Anion gap 4 (L) 5 - 15   Recent Results (from the past 240 hour(s))  Urine Culture     Status: None   Collection Time: 03/03/18  6:21 PM  Result Value Ref Range Status    Specimen Description   Final    URINE, RANDOM Performed at [facility], [address], [place], [place] [address]    Special Requests   Final    NONE Performed at [facility], [address] [place], [place] [address]    Culture   Final    NO GROWTH Performed at [facility], [place] [address] [place], [place] [address]    Report Status 03/05/2018 FINAL  Final    Renal Function: Recent Labs    03/03/18 1821 03/04/18 0446 03/05/18 0505  CREATININE 1.67* 1.62* 1.22   Estimated Creatinine Clearance: 52.1 mL/min (by C-G formula based on SCr of 1.22 mg/dL).  Radiologic Imaging: Dg Abd 1 View  Result Date: 03/05/2018 CLINICAL DATA:  Kidney stone. EXAM: ABDOMEN - 1 VIEW COMPARISON:  CT scan of December 03, 2015. FINDINGS: The bowel gas pattern is normal. Phleboliths are noted in the pelvis. Left-sided ureteral stent is noted in grossly good position. Probable calculus is seen projected over the left kidney. No ureteral calculi are noted. IMPRESSION: Left-sided ureteral stent is in grossly good position. Probable left renal calculus. No evidence of bowel obstruction or ileus. Electronically Signed   By: Marijo Conception, M.D.   On: 03/05/2018 14:04   Dg Chest Portable 1 View  Result Date: 03/03/2018 CLINICAL DATA:  Weakness for 1 week, somnolence. EXAM: PORTABLE CHEST 1 VIEW COMPARISON:  12/03/2015. FINDINGS: Mild cardiac enlargement. No consolidation or edema. No effusion or pneumothorax. Bones unremarkable. PICC line tip mid SVC. Similar appearance to priors. IMPRESSION: Mild cardiac enlargement. No active disease. PICC line tip mid SVC. Electronically Signed   By: Staci Righter M.D.   On: 03/03/2018 18:14    I independently reviewed the above imaging studies.  Impression/Recommendation  72 year old male with: 1.  Left UPJ stone s/p left JJ stent on 02/25/18 2.  AKI-Improving with IVF  -The patient is scheduled to have his stone treated via ESWL in  approximately 2 weeks at the [place].  Abx per primary team.  His UA on admission was likely a reflection of having a stent in place and not a true UTI.  I encouraged hydration and dietary modifications to prevent kidney stone formation.   F/u with [facility] PRN  Ellison Hughs, MD [facility] 03/05/2018, 3:42 PM

## 2018-04-14 NOTE — ED Notes (Signed)
Work note completed by accident

## 2018-10-13 ENCOUNTER — Telehealth: Payer: Self-pay | Admitting: *Deleted

## 2018-10-13 ENCOUNTER — Encounter: Payer: Self-pay | Admitting: Radiation Oncology

## 2018-10-13 NOTE — Progress Notes (Addendum)
GU Location of Tumor / Histology: prostatic adenocarcinoma  If Prostate Cancer, Gleason Score is (4 + 3) and PSA is (91.2) in 05/2018. Prostate volume: 22 grams.  Jimmy Conner had a kidney stone that tore his ureter in June 2019. His daughter in law explains he was taken to the [facility] for management then referred to urologist, Dr. Chana Bode.   Biopsies of prostate (if applicable) revealed:   Past/Anticipated interventions by urology, if any: prostate biopsy, bone scan, ct scan, PET scan ordered but not done,  referral for consideration of radiation therapy  Past/Anticipated interventions by medical oncology, if any: no  Weight changes, if any: 40 lb in 3 years.   Bowel/Bladder complaints, if any: IPSS 25. SHIM 24. Denies dysuria, hematuria, urinary leakage or incontinence.    Nausea/Vomiting, if any: no  Pain issues, if any:  Left shoulder x 2years  SAFETY ISSUES:  Prior radiation? no  Pacemaker/ICD? no  Possible current pregnancy? no, male patient  Is the patient on methotrexate? no  Current Complaints / other details:  73 year old male. Divorced with three children. Resides with youngest son and daughter in law in [place]. Patient does not drive. Patient scheduled to follow up with oncologist next week. Reports his cousin is the only one of his family member with a history of (prostate) cancer.

## 2018-10-13 NOTE — Telephone Encounter (Signed)
CALLED PATIENT TO ASK ABOUT COMING IN @ 7:15 AM ON 10-14-18, PATIENT DECLINED DUE TO DAUGHTER BRINGING HIM, NOTIFIED DR. MANNING'S NURSE

## 2018-10-14 ENCOUNTER — Ambulatory Visit
Admission: RE | Admit: 2018-10-14 | Discharge: 2018-10-14 | Disposition: A | Discharge status: Home / Self Care | Payer: Medicare Other | Source: Ambulatory Visit | Attending: Radiation Oncology | Admitting: Radiation Oncology

## 2018-10-14 ENCOUNTER — Encounter: Payer: Self-pay | Admitting: Radiation Oncology

## 2018-10-14 ENCOUNTER — Other Ambulatory Visit: Payer: Self-pay

## 2018-10-14 ENCOUNTER — Encounter: Payer: Self-pay | Admitting: Medical Oncology

## 2018-10-14 VITALS — BP 138/87 | HR 78 | Temp 97.8°F | Resp 20 | Ht 68.0 in | Wt 142.2 lb

## 2018-10-14 DIAGNOSIS — C61 Malignant neoplasm of prostate: Secondary | ICD-10-CM

## 2018-10-14 DIAGNOSIS — Z79899 Other long term (current) drug therapy: Secondary | ICD-10-CM | POA: Diagnosis not present

## 2018-10-14 DIAGNOSIS — B20 Human immunodeficiency virus [HIV] disease: Secondary | ICD-10-CM | POA: Diagnosis not present

## 2018-10-14 DIAGNOSIS — I10 Essential (primary) hypertension: Secondary | ICD-10-CM | POA: Diagnosis not present

## 2018-10-14 HISTORY — DX: Malignant neoplasm of prostate: C61

## 2018-10-14 NOTE — Progress Notes (Signed)
See progress note under physician encounter. 

## 2018-10-14 NOTE — Progress Notes (Signed)
Radiation Oncology         (336) [phone] ________________________________  Initial Outpatient Consultation  Name: Jimmy Conner MRN: [id number]  Date: 10/14/2018  DOB: 1946/01/25  NU:[id number], Jimmy Quan, DO   REFERRING PHYSICIAN: Caralyn Guile, DO  DIAGNOSIS: 73 y.o. gentleman with Stage T1c adenocarcinoma of the prostate with Gleason score of 4+3, and PSA of 81.8.    ICD-10-CM   1. Malignant neoplasm of prostate ([place]) C61     HISTORY OF PRESENT ILLNESS: Jimmy Conner is a 73 y.o. male with a diagnosis of prostate cancer. He had a kidney stone that tore his ureter in June 2019. His daughter-in-law explains he was taken to the [facility] for management then referred for evaluation in urology by Dr. Aretha Parrot on 04/15/2018, where a digital rectal examination was performed at that time revealing 1+ irregularity in areas of the prostate, but no nodularity. He was noted to have an elevated PSA of 91.2 in September 2019.  Repeat PSA in October 2019 was 81.8.  The patient proceeded to transrectal ultrasound with 18 biopsies of the prostate on 07/27/2018.  The prostate volume measured 22 cc.  Out of 18 core biopsies, 12 were positive.  The maximum Gleason score was 4+3, and this was seen in 4/10 cores of the right prostate and 8/8 cores of the left prostate.  He had CT A/P and bone scan performed at the [facility] for disease staging which were negative for metastatic disease. The bone scan did show focal uptake in the left 4th rib, but patient reports history of trauma in this area. He was recommended to undergo Axumin PET, but this has not yet been arranged.  Of note, the patient's PMH is also significant for HIV and urolithiasis.  His HIV has remained under good control on antiviral cocktail. He presents today accompanied by his son and daughter-in-law, with whom he lives. His daughter-in-law serves as his primary transportation.  The patient reviewed the biopsy results with his urologist  and he has kindly been referred today for discussion of potential radiation treatment options.   PREVIOUS RADIATION THERAPY: No  PAST MEDICAL HISTORY:  Past Medical History:  Diagnosis Date  . HIV (human immunodeficiency virus infection) ([place])   . Hypertension   . Immune deficiency disorder ([place])   . Prostate cancer ([place])       PAST SURGICAL HISTORY: Past Surgical History:  Procedure Laterality Date  . CHOLECYSTECTOMY    . PROSTATE BIOPSY    . SPLENECTOMY      FAMILY HISTORY:  Family History  Problem Relation Age of Onset  . Prostate cancer Cousin     SOCIAL HISTORY:  Social History   Socioeconomic History  . Marital status: Divorced    Spouse name: Not on file  . Number of children: 3  . Years of education: Not on file  . Highest education level: Not on file  Occupational History  . Not on file  Social Needs  . Financial resource strain: Not on file  . Food insecurity:    Worry: Not on file    Inability: Not on file  . Transportation needs:    Medical: Not on file    Non-medical: Not on file  Tobacco Use  . Smoking status: Never Smoker  . Smokeless tobacco: Never Used  Substance and Sexual Activity  . Alcohol use: No  . Drug use: Never  . Sexual activity: Not Currently  Lifestyle  . Physical activity:  Days per week: Not on file    Minutes per session: Not on file  . Stress: Not on file  Relationships  . Social connections:    Talks on phone: Not on file    Gets together: Not on file    Attends religious service: Not on file    Active member of club or organization: Not on file    Attends meetings of clubs or organizations: Not on file    Relationship status: Not on file  . Intimate partner violence:    Fear of current or ex partner: Not on file    Emotionally abused: Not on file    Physically abused: Not on file    Forced sexual activity: Not on file  Other Topics Concern  . Not on file  Social History Narrative   Divorced with three  children. Resides with youngest son and daughter-in-law. Does not drive.  Accompanied by son and daughter-in-law today, with whom he lives.  ALLERGIES: Hydrocodone-acetaminophen  MEDICATIONS:  Current Outpatient Medications  Medication Sig Dispense Refill  . abacavir-dolutegravir-lamiVUDine (TRIUMEQ) 600-50-300 MG tablet Take 1 tablet by mouth daily.    Marland Kitchen amLODipine (NORVASC) 5 MG tablet Take 5 mg by mouth daily.    Marland Kitchen sulfamethoxazole-trimethoprim (BACTRIM DS,SEPTRA DS) 800-160 MG tablet Take 1 tablet by mouth every Monday, Wednesday, and Friday.    . Tenofovir Alafenamide Fumarate 25 MG TABS Take 1 tablet by mouth daily.     No current facility-administered medications for this encounter.     REVIEW OF SYSTEMS:  On review of systems, the patient reports that he is doing well overall. He denies any chest pain, shortness of breath, cough, fevers, chills, night sweats. He reports 40 pound weight loss in the past 3 years. He denies any bowel disturbances, and denies abdominal pain, nausea or vomiting. He reports chronic left shoulder pain, but denies any new musculoskeletal or joint aches or pains. His IPSS was 25, indicating severe urinary symptoms. He denies dysuria, hematuria, leakage or incontinence. His SHIM was 24, indicating he does not have erectile dysfunction. A complete review of systems is obtained and is otherwise negative.    PHYSICAL EXAM:  Wt Readings from Last 3 Encounters:  10/14/18 142 lb 4 oz (64.5 kg)  03/03/18 141 lb 14.4 oz (64.4 kg)   Temp Readings from Last 3 Encounters:  10/14/18 97.8 F (36.6 C) (Oral)  03/05/18 (!) 97.4 F (36.3 C)  12/03/15 97.4 F (36.3 C)   BP Readings from Last 3 Encounters:  10/14/18 138/87  03/05/18 139/79  12/03/15 153/99   Pulse Readings from Last 3 Encounters:  10/14/18 78  03/05/18 (!) 57  12/03/15 97   Pain Assessment Pain Score: 0-No pain/10  In general this is a well appearing caucasian male in no acute distress. He  is alert and oriented x4 and appropriate throughout the examination. HEENT reveals that the patient is normocephalic, atraumatic. EOMs are intact. PERRLA. Skin is intact without any evidence of gross lesions. Cardiovascular exam reveals a regular rate and rhythm, no clicks rubs or murmurs are auscultated. Chest is clear to auscultation bilaterally. Lymphatic assessment is performed and does not reveal any adenopathy in the cervical, supraclavicular, axillary, or inguinal chains. Abdomen has active bowel sounds in all quadrants and is intact. The abdomen is soft, non tender, non distended. Lower extremities are negative for pretibial pitting edema, deep calf tenderness, cyanosis or clubbing.   KPS = 90  100 - Normal; no complaints; no evidence of disease.  90   - Able to carry on normal activity; minor signs or symptoms of disease. 80   - Normal activity with effort; some signs or symptoms of disease. 43   - Cares for self; unable to carry on normal activity or to do active work. 60   - Requires occasional assistance, but is able to care for most of his personal needs. 50   - Requires considerable assistance and frequent medical care. 28   - Disabled; requires special care and assistance. 55   - Severely disabled; hospital admission is indicated although death not imminent. 34   - Very sick; hospital admission necessary; active supportive treatment necessary. 10   - Moribund; fatal processes progressing rapidly. 0     - Dead  Karnofsky DA, Abelmann [place], Craver LS and [facility] [phone]) The use of the nitrogen mustards in the palliative treatment of carcinoma: with particular reference to bronchogenic carcinoma Cancer 1 634-56  LABORATORY DATA:  Lab Results  Component Value Date   WBC 3.7 (L) 03/04/2018   HGB 11.0 (L) 03/04/2018   HCT 32.5 (L) 03/04/2018   MCV 86.7 03/04/2018   PLT 205 03/04/2018   Lab Results  Component Value Date   NA 141 03/05/2018   K 4.1 03/05/2018   CL 109  03/05/2018   CO2 28 03/05/2018   Lab Results  Component Value Date   ALT 20 03/03/2018   AST 32 03/03/2018   ALKPHOS 84 03/03/2018   BILITOT 0.5 03/03/2018     RADIOGRAPHY: No results found.    IMPRESSION/PLAN: 1. 73 y.o. gentleman with Stage T1c adenocarcinoma of the prostate with Gleason Score of 4+3, and PSA of 81.8. We discussed the patient's workup and outlined the nature of prostate cancer in this setting. The patient's T stage, Gleason's score, and PSA put him into the high risk group. We discussed that while his disease staging scans to date have failed to demonstrate obvious metastatic disease, there is a high probability there is disease already outside of the prostate and an Axumin PET scan might give [place] more information in this regard given it's higher sensitivity and specificity.  Accordingly, he is eligible for LT-ADT in combination with 8 weeks of external radiation. We discussed the available radiation techniques, and focused on the details and logistics and delivery. We discussed and outlined the risks, benefits, short and long-term effects associated with radiotherapy and compared and contrasted these with prostatectomy. We also detailed the role of ADT in the treatment of high-risk prostate cancer and outlined the associated side effects that could be expected with this therapy.  At the end of the conversation the patient is interested in moving forward with 8 weeks of external beam therapy in combination with ADT. He has not received his first Lupron injection. We will contact the [facility] [place] to make arrangements for start of ADT and follow up regarding intentions to obtain [place] PET. We will share our discussion with his treatment team at the [facility] and move forward with treatment planning in anticipation of beginning IMRT in mid-April, after approximately 8 weeks of ADT.  We spent 60 minutes face to face with the patient and more than 50% of that time was spent in  counseling and/or coordination of care.    Nicholos Johns, PA-C    Tyler Pita, MD  [facility] Direct Dial: [phone]  Fax: [phone] .com  Skype  LinkedIn  This document serves as a record of services personally  performed by Tyler Pita, MD and Freeman Caldron, PA-C. It was created on their behalf by Rae Lips, a trained medical scribe. The creation of this record is based on the scribe's personal observations and the providers' statements to them. This document has been checked and approved by the attending providers.

## 2018-10-15 DIAGNOSIS — C61 Malignant neoplasm of prostate: Secondary | ICD-10-CM | POA: Insufficient documentation

## 2018-10-20 ENCOUNTER — Encounter: Payer: Self-pay | Admitting: Medical Oncology

## 2018-10-20 NOTE — Progress Notes (Signed)
I called Jimmy Conner to see if he has received his hormone injection at the [place]. He states he has a scan scheduled for tomorrow at 1 pm but no word on ADT. [facility] and get [facility] but all clinics closed due to President's Day. I will follow up tomorrow.

## 2018-10-23 ENCOUNTER — Telehealth: Payer: Self-pay | Admitting: Medical Oncology

## 2018-10-23 NOTE — Telephone Encounter (Signed)
Called the [facility] to ask for PET scan results from 2/19. I was informed that patient called and cancelled. I asked if he is scheduled to receive his ADT and he has an appointment this afternoon with the medical oncologist and she will relay message regarding ADT.

## 2018-10-27 ENCOUNTER — Telehealth: Payer: Self-pay | Admitting: *Deleted

## 2018-10-27 NOTE — Telephone Encounter (Signed)
On 10-27-18 fax medical records to [facility] health care clinic

## 2018-10-28 ENCOUNTER — Telehealth: Payer: Self-pay | Admitting: Medical Oncology

## 2018-10-28 ENCOUNTER — Encounter: Payer: Self-pay | Admitting: Medical Oncology

## 2018-10-28 NOTE — Telephone Encounter (Signed)
Called [facility] [place] and spoke with Gabriel Cirri to verify  patient received his androgen deprivation. She states he received Eligard 45 mg on 10/23/18. I asked for records from his visit be faxed to Dr. Tammi Klippel.

## 2018-11-06 ENCOUNTER — Encounter: Payer: Self-pay | Admitting: Medical Oncology

## 2018-11-06 ENCOUNTER — Telehealth: Payer: Self-pay | Admitting: Medical Oncology

## 2018-11-06 ENCOUNTER — Encounter: Payer: Self-pay | Admitting: Urology

## 2018-11-06 NOTE — Progress Notes (Signed)
Jimmy Conner states he is concerned about the knot in his abdomen where he received his androgen deprivation. I discussed with him this is normal for the site to be red, hard and sore. I informed him that the site will being to heal within the next few weeks.  He was just worried something was wrong. He is scheduled for CT simulation 4/3. He voiced concerns about having to rely on his daughter in law for transportation when he starts radiation. I informed him that I can help get his assistance with transportation the days she is unable to bring him. He was pleased. I asked him to call me with questions or concerns.

## 2018-11-06 NOTE — Telephone Encounter (Signed)
Spoke with Mr. Jimmy Conner as follow up to radiation consult. He did received ADT 2/20 and had Axium PET.

## 2018-11-06 NOTE — Progress Notes (Signed)
Per records from recent follow-up visit at the [facility], the patient did have an [place] PET scan which revealed prostate confined disease only, no evidence for distant metastatic disease.  He received Eligard ADT on 10/23/2018.  Therefore, he will be eligible to begin his radiation treatments the week of 12/15/18.  He is scheduled for CT SIM on 12/05/18 in preparation to begin an 8-week course of daily prostate IMRT.  Nicholos Johns, MMS, PA-C [place] at [place]: [phone]  Fax: [phone]

## 2018-11-10 ENCOUNTER — Telehealth: Payer: Self-pay | Admitting: Medical Oncology

## 2018-11-10 NOTE — Telephone Encounter (Signed)
Toniann Ket, RN called me from [facility] to inform me patient did not have a PET done. He was denied by insurance. The scan results that were discussed in the last office note are the CT results. I notified [employer], PA.

## 2018-11-10 NOTE — Telephone Encounter (Signed)
Called [facility] [place] [facility] to request copy of [place] PET be faxed to [facility].

## 2018-12-04 ENCOUNTER — Telehealth: Payer: Self-pay | Admitting: Medical Oncology

## 2018-12-04 ENCOUNTER — Encounter: Payer: Self-pay | Admitting: Urology

## 2018-12-04 NOTE — Progress Notes (Signed)
Per Dr. Tammi Klippel, the patient and his daughter in law were contacted and informed that we needed to delay the start of his radiation treatments given the current recommendations surrounding the COVID-19 pandemic.  Fortunately, the patient is protected with ADT which was started on 10/23/2018, making it most reasonable and safe to delay his treatment until June 2020 in hopes that we will have better control of this pandemic at that time and restrictions will be lifted.  He has been rescheduled for CT simulation on 02/03/2019 with plans to begin his treatment shortly thereafter.  They appeared to have a good understanding of these recommendations and were comfortable and in agreement with the stated plan.  They know to call at anytime with any questions or concerns in the interim.  Nicholos Johns, MMS, PA-C [place] at [place]: [phone]  Fax: [phone]

## 2018-12-04 NOTE — Telephone Encounter (Signed)
Opened in error

## 2018-12-05 ENCOUNTER — Ambulatory Visit: Payer: Medicare Other | Admitting: Radiation Oncology

## 2019-02-03 ENCOUNTER — Ambulatory Visit: Admission: RE | Admit: 2019-02-03 | Payer: Medicare Other | Source: Ambulatory Visit | Admitting: Radiation Oncology

## 2019-02-03 ENCOUNTER — Telehealth: Payer: Self-pay | Admitting: *Deleted

## 2019-02-03 NOTE — Telephone Encounter (Signed)
CALLED PATIENT TO RESCHEDULED MISSED SIM, LVM FOR A RETURN CALL

## 2019-03-12 ENCOUNTER — Telehealth: Payer: Self-pay | Admitting: Medical Oncology

## 2019-03-12 NOTE — Telephone Encounter (Signed)
Attempted to reach patient but phone not set up to receive messages. I called  Jimmy Conner and left him a message to inform him his dad missed his CT simulation 6/2 and we have not been contacted to reschedule. I asked him to call [name] directly or he can call my office and I will assist him. I stressed the importance of getting it rescheduled so we can proceed with radiation.

## 2019-03-20 ENCOUNTER — Telehealth: Payer: Self-pay | Admitting: *Deleted

## 2019-03-20 NOTE — Telephone Encounter (Signed)
CALLED PATIENT'S SON JUSTIN TO ASK ABOUT RESCHEDULING SIM, LVM FOR A RETURN CALL

## 2019-04-24 ENCOUNTER — Encounter: Payer: Self-pay | Admitting: Urology

## 2019-04-24 ENCOUNTER — Telehealth: Payer: Self-pay | Admitting: Medical Oncology

## 2019-04-24 ENCOUNTER — Telehealth: Payer: Self-pay | Admitting: *Deleted

## 2019-04-24 ENCOUNTER — Encounter: Payer: Self-pay | Admitting: Medical Oncology

## 2019-04-24 NOTE — Progress Notes (Signed)
Patient missed his [facility] appointment on 02/03/19 and has been unable to be reached since that time to reschedule his appointments.  We were recently notified by the [facility] in [place] that he has a new phone number and a friend who is helping him. His phone number was updated in his chart and I added the number for his friend, Jimmy Conner so Jimmy Conner will reach out to [facility], to get his CT simulation rescheduled.   Courtney [phone]  [name]'s new number [phone]   Nicholos Johns, MMS, PA-C [place] at [place]: [phone]  Fax: [phone]

## 2019-04-24 NOTE — Telephone Encounter (Signed)
CALLED PATIENT TO INFORM OF SIM APPT. FOR 04-28-19 - ARRIVAL TIME- 7:45 AM, SPOKE WITH Jimmy Conner- [phone], AND SHE IS AWARE OF THIS APPT.

## 2019-04-24 NOTE — Telephone Encounter (Signed)
Received call from Maryagnes Amos, RN, [place], [place] to inform us patient was seen yesterday. He informed them that he has not received radiation. I informed her, he was a no show for his CT simulation 6/2 and our office has made multiple attempts to reach him and his son. He was accompanied by his friend, Courtney Heys  during visit yesterday. She is helping to provide transportation and assist him at his medical visits. Her number is [phone] and patient has a new phone number, [phone]. I will forward this contact information to [facility] and get him rescheduled for CT simulation.

## 2019-04-27 ENCOUNTER — Telehealth: Payer: Self-pay | Admitting: Medical Oncology

## 2019-04-27 NOTE — Telephone Encounter (Signed)
Left a voicemail with Maryagnes Amos, RN [place], [place] to inform her patient is scheduled for CT simulation 04/28/19.

## 2019-04-27 NOTE — Progress Notes (Signed)
  Radiation Oncology         (336) [phone] ________________________________  Name: Jimmy Conner MRN: [id number]  Date: 04/28/2019  DOB: [birth year]  SIMULATION AND TREATMENT PLANNING NOTE    ICD-10-CM   1. Malignant neoplasm of prostate ([place])  C61     DIAGNOSIS:  73 y.o. gentleman with Stage T1c adenocarcinoma of the prostate with Gleason score of 4+3, and PSA of 81.8.  NARRATIVE:  The patient was brought to the [place].  Identity was confirmed.  All relevant records and images related to the planned course of therapy were reviewed.  The patient freely provided informed written consent to proceed with treatment after reviewing the details related to the planned course of therapy. The consent form was witnessed and verified by the simulation staff.  Then, the patient was set-up in a stable reproducible supine position for radiation therapy.  A vacuum lock pillow device was custom fabricated to position his legs in a reproducible immobilized position.  Then, I performed a urethrogram under sterile conditions to identify the prostatic apex.  CT images were obtained.  Surface markings were placed.  The CT images were loaded into the planning software.  Then the prostate target and avoidance structures including the rectum, bladder, bowel and hips were contoured.  Treatment planning then occurred.  The radiation prescription was entered and confirmed.  A total of one complex treatment devices were fabricated. I have requested : Intensity Modulated Radiotherapy (IMRT) is medically necessary for this case for the following reason:  Rectal sparing.Marland Kitchen  PLAN:  The patient will receive 45 Gy in 25 fractions of 1.8 Gy, followed by a boost to the prostate to a total dose of 75 Gy with 15 additional fractions of 2.0 Gy.  ________________________________  Sheral Apley Tammi Klippel, M.D.

## 2019-04-28 ENCOUNTER — Ambulatory Visit
Admission: RE | Admit: 2019-04-28 | Discharge: 2019-04-28 | Disposition: A | Discharge status: Home / Self Care | Payer: Medicare Other | Source: Ambulatory Visit | Attending: Radiation Oncology | Admitting: Radiation Oncology

## 2019-04-28 ENCOUNTER — Encounter: Payer: Self-pay | Admitting: Medical Oncology

## 2019-04-28 ENCOUNTER — Other Ambulatory Visit: Payer: Self-pay

## 2019-04-28 DIAGNOSIS — C61 Malignant neoplasm of prostate: Secondary | ICD-10-CM

## 2019-05-05 ENCOUNTER — Telehealth: Payer: Self-pay | Admitting: Genetic Counselor

## 2019-05-05 DIAGNOSIS — C61 Malignant neoplasm of prostate: Secondary | ICD-10-CM | POA: Diagnosis not present

## 2019-05-05 NOTE — Telephone Encounter (Signed)
Received a genetic counseling referral from the [place] for prostate cancer. Pt has been cld and scheduled to see Raquel Sarna on 9/8 at 1pm.

## 2019-05-07 ENCOUNTER — Ambulatory Visit
Admission: RE | Admit: 2019-05-07 | Discharge: 2019-05-07 | Disposition: A | Discharge status: Home / Self Care | Payer: No Typology Code available for payment source | Source: Ambulatory Visit | Attending: Radiation Oncology | Admitting: Radiation Oncology

## 2019-05-07 ENCOUNTER — Other Ambulatory Visit: Payer: Self-pay

## 2019-05-07 DIAGNOSIS — C61 Malignant neoplasm of prostate: Secondary | ICD-10-CM | POA: Diagnosis not present

## 2019-05-08 ENCOUNTER — Ambulatory Visit
Admission: RE | Admit: 2019-05-08 | Discharge: 2019-05-08 | Disposition: A | Discharge status: Home / Self Care | Payer: No Typology Code available for payment source | Source: Ambulatory Visit | Attending: Radiation Oncology | Admitting: Radiation Oncology

## 2019-05-08 DIAGNOSIS — C61 Malignant neoplasm of prostate: Secondary | ICD-10-CM | POA: Diagnosis not present

## 2019-05-12 ENCOUNTER — Inpatient Hospital Stay: Payer: Medicare Other

## 2019-05-12 ENCOUNTER — Other Ambulatory Visit: Payer: Self-pay

## 2019-05-12 ENCOUNTER — Inpatient Hospital Stay: Payer: Medicare Other | Admitting: Genetic Counselor

## 2019-05-12 ENCOUNTER — Ambulatory Visit
Admission: RE | Admit: 2019-05-12 | Discharge: 2019-05-12 | Disposition: A | Discharge status: Home / Self Care | Payer: No Typology Code available for payment source | Source: Ambulatory Visit | Attending: Radiation Oncology | Admitting: Radiation Oncology

## 2019-05-12 DIAGNOSIS — C61 Malignant neoplasm of prostate: Secondary | ICD-10-CM | POA: Diagnosis not present

## 2019-05-13 ENCOUNTER — Ambulatory Visit
Admission: RE | Admit: 2019-05-13 | Discharge: 2019-05-13 | Disposition: A | Discharge status: Home / Self Care | Payer: No Typology Code available for payment source | Source: Ambulatory Visit | Attending: Radiation Oncology | Admitting: Radiation Oncology

## 2019-05-13 ENCOUNTER — Inpatient Hospital Stay: Payer: Medicare Other | Attending: Radiation Oncology | Admitting: Genetic Counselor

## 2019-05-13 ENCOUNTER — Encounter: Payer: Self-pay | Admitting: Genetic Counselor

## 2019-05-13 ENCOUNTER — Other Ambulatory Visit: Payer: Self-pay

## 2019-05-13 DIAGNOSIS — C61 Malignant neoplasm of prostate: Secondary | ICD-10-CM

## 2019-05-13 DIAGNOSIS — Z800 Family history of malignant neoplasm of digestive organs: Secondary | ICD-10-CM | POA: Insufficient documentation

## 2019-05-13 DIAGNOSIS — Z85828 Personal history of other malignant neoplasm of skin: Secondary | ICD-10-CM

## 2019-05-13 DIAGNOSIS — Z801 Family history of malignant neoplasm of trachea, bronchus and lung: Secondary | ICD-10-CM

## 2019-05-13 NOTE — Progress Notes (Signed)
REFERRING PROVIDER: Carlean Jews, MD [address] [place],  [place] [address]  PRIMARY PROVIDER:  Cl[facility], [facility]  PRIMARY REASON FOR VISIT:  1. Malignant neoplasm of prostate ([place])   2. Family history of colon cancer   3. Family history of lung cancer   4. Personal history of skin cancer   5. Personal history of other malignant neoplasm of skin      HISTORY OF PRESENT ILLNESS:   Jimmy Conner, a 73 y.o. male, was seen for a [place] cancer genetics consultation at the request of Dr. Neita Carp due to a personal history of prostate cancer.  Jimmy Conner presents to clinic today to discuss the possibility of a hereditary predisposition to cancer, genetic testing, and to further clarify his future cancer risks, as well as potential cancer risks for family members.   In 2020, at the age of 35, Jimmy Conner was diagnosed with prostate cancer, Gleason 4+3. 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 He also had a colonoscopy within the last year that discovered three "cancerous" polyps, according to Jimmy Conner (these records were not available for review).   CANCER HISTORY:  Oncology History   No history exists.    Past Medical History:  Diagnosis Date  . Family history of colon cancer   . Family history of lung cancer   . HIV (human immunodeficiency virus infection) ([place])   . Hypertension   . Immune deficiency disorder ([place])   . Personal history of skin cancer   . Prostate cancer ([place])   . Skin cancer     Past Surgical History:  Procedure Laterality Date  . CHOLECYSTECTOMY    . PROSTATE BIOPSY    . SPLENECTOMY      Social History   Socioeconomic History  . Marital status: Divorced   Spouse name: Not on file  . Number of children: 3  . Years of education: Not on file  . Highest education level: Not on file  Occupational History  . Not on file  Social Needs  . Financial resource strain: Not on file  . Food insecurity    Worry: Not on file    Inability: Not on file  . Transportation needs    Medical: Not on file    Non-medical: Not on file  Tobacco Use  . Smoking status: Never Smoker  . Smokeless tobacco: Never Used  Substance and Sexual Activity  . Alcohol use: No  . Drug use: Never  . Sexual activity: Not Currently  Lifestyle  . Physical activity    Days per week: Not on file    Minutes per session: Not on file  . Stress: Not on file  Relationships  . Social Herbalist on phone: Not on file    Gets together: Not on file    Attends religious service: Not on file    Active member of club or organization: Not on file    Attends meetings of clubs or organizations: Not on file    Relationship status: Not on file  Other Topics Concern  . Not on file  Social History Narrative   Divorced with three children. Resides with youngest  son and daughter-in-law. Does not drive.     FAMILY HISTORY:  We obtained a detailed, 4-generation family history.  Significant diagnoses are listed below: Family History  Problem Relation Age of Onset  . Prostate cancer Cousin        not biologically related  . Colon cancer Maternal Uncle 34  . Lung cancer Paternal Uncle        diagnosed in his 21s, smoker     Jimmy Conner has two biological sons and one biological daughter, all in their 27s and 5s. He has three sisters and two brothers currently living in their 36s and 19s, and a brother who died at the age of 37 months. None of these individuals have had a diagnosis of cancer.  Jimmy Conner mother died at the age of 21 due to a stroke. He has one maternal uncle and two maternal aunts. The uncle died at the age of 44 due to colon cancer. His maternal grandparents died  in their 40s and 64s. There are no other known diagnoses of cancer on the maternal side of the family.  Jimmy Conner father died in his 51s due to liver cirrhosis induced by alcoholism. He had one paternal aunt and three paternal uncles. One of these uncles died from lung cancer in his 58s and had a history of heavy smoking. Jimmy Conner mentioned that his aunt's husband had a son who also had prostate cancer, but this cousin is not biologically related to him. There are no other known diagnoses of cancer on the paternal side of the family.  Jimmy Conner is unaware of previous family history of genetic testing for hereditary cancer risks. Patient's maternal ancestors are of unspecified descent, and paternal ancestors are of unspecified descent. There is no reported Ashkenazi Jewish ancestry. There is no known consanguinity.  GENETIC COUNSELING ASSESSMENT: Jimmy Conner is a 73 y.o. male with a personal and family history of cancer which is not suggestive of a hereditary cancer syndrome. We, therefore, discussed and recommended the following at today's visit.   DISCUSSION: We discussed that 5-10% of prostate cancer is hereditary. Genes associated with hereditary prostate cancer syndromes include BRCA1/2, EPCAM, MLH1, MSH2, MSH6, PMS2, and HOXB13, among others. We discussed that identifying hereditary causes of cancer is beneficial for several reasons including knowing about other cancer risks, identifying potential screening and risk-reduction options that may be appropriate, and to understand if other family members could be at risk for cancer and allow them to undergo genetic testing.   We reviewed the characteristics, features and inheritance patterns of hereditary cancer syndromes. We discussed with Jimmy Conner that his personal and family history does not meet insurance or NCCN criteria for genetic testing and is not highly consistent with a familial hereditary cancer syndrome.  We feel he is at low risk to harbor a  gene mutation associated with such a condition. Thus, we did not recommend any genetic testing, at this time, and recommended Jimmy Conner continue to follow the cancer screening guidelines given by his primary healthcare provider.   PLAN: Jimmy Conner did not wish to pursue genetic testing at today's visit. We understand this decision and remain available to coordinate genetic testing at any time in the future. We, therefore, recommend Jimmy Conner continue to follow the cancer screening guidelines given by his primary healthcare provider.  Jimmy Conner questions were answered to his satisfaction today. Our contact information was provided should additional questions or concerns arise. Thank you for the referral and allowing  [place] to share in the care of your patient.   Clint Guy, MS, [facility] Certified Genetic Counselor [email or web address] Phone: [phone]  The patient was seen for a total of 30 minutes in face-to-face genetic counseling.  This patient was discussed with Drs. Magrinat, Lindi Adie and/or Burr Medico who agrees with the above.    _______________________________________________________________________ For Office Staff:  Number of people involved in session: 1 Was an Intern/ student involved with case: no

## 2019-05-14 ENCOUNTER — Other Ambulatory Visit: Payer: Self-pay

## 2019-05-14 ENCOUNTER — Ambulatory Visit
Admission: RE | Admit: 2019-05-14 | Discharge: 2019-05-14 | Disposition: A | Discharge status: Home / Self Care | Payer: No Typology Code available for payment source | Source: Ambulatory Visit | Attending: Radiation Oncology | Admitting: Radiation Oncology

## 2019-05-14 DIAGNOSIS — C61 Malignant neoplasm of prostate: Secondary | ICD-10-CM | POA: Diagnosis not present

## 2019-05-15 ENCOUNTER — Other Ambulatory Visit: Payer: Self-pay | Admitting: Radiation Oncology

## 2019-05-15 ENCOUNTER — Other Ambulatory Visit: Payer: Self-pay

## 2019-05-15 ENCOUNTER — Ambulatory Visit
Admission: RE | Admit: 2019-05-15 | Discharge: 2019-05-15 | Disposition: A | Discharge status: Home / Self Care | Payer: No Typology Code available for payment source | Source: Ambulatory Visit | Attending: Radiation Oncology | Admitting: Radiation Oncology

## 2019-05-15 DIAGNOSIS — C61 Malignant neoplasm of prostate: Secondary | ICD-10-CM | POA: Diagnosis not present

## 2019-05-18 ENCOUNTER — Other Ambulatory Visit: Payer: Self-pay

## 2019-05-18 ENCOUNTER — Ambulatory Visit
Admission: RE | Admit: 2019-05-18 | Discharge: 2019-05-18 | Disposition: A | Discharge status: Home / Self Care | Payer: No Typology Code available for payment source | Source: Ambulatory Visit | Attending: Radiation Oncology | Admitting: Radiation Oncology

## 2019-05-18 DIAGNOSIS — C61 Malignant neoplasm of prostate: Secondary | ICD-10-CM | POA: Diagnosis not present

## 2019-05-19 ENCOUNTER — Ambulatory Visit
Admission: RE | Admit: 2019-05-19 | Discharge: 2019-05-19 | Disposition: A | Discharge status: Home / Self Care | Payer: No Typology Code available for payment source | Source: Ambulatory Visit | Attending: Radiation Oncology | Admitting: Radiation Oncology

## 2019-05-19 ENCOUNTER — Other Ambulatory Visit: Payer: Self-pay

## 2019-05-19 DIAGNOSIS — C61 Malignant neoplasm of prostate: Secondary | ICD-10-CM | POA: Diagnosis not present

## 2019-05-20 ENCOUNTER — Ambulatory Visit
Admission: RE | Admit: 2019-05-20 | Discharge: 2019-05-20 | Disposition: A | Discharge status: Home / Self Care | Payer: No Typology Code available for payment source | Source: Ambulatory Visit | Attending: Radiation Oncology | Admitting: Radiation Oncology

## 2019-05-20 ENCOUNTER — Other Ambulatory Visit: Payer: Self-pay

## 2019-05-20 DIAGNOSIS — C61 Malignant neoplasm of prostate: Secondary | ICD-10-CM | POA: Diagnosis not present

## 2019-05-21 ENCOUNTER — Ambulatory Visit
Admission: RE | Admit: 2019-05-21 | Discharge: 2019-05-21 | Disposition: A | Discharge status: Home / Self Care | Payer: No Typology Code available for payment source | Source: Ambulatory Visit | Attending: Radiation Oncology | Admitting: Radiation Oncology

## 2019-05-21 ENCOUNTER — Other Ambulatory Visit: Payer: Self-pay

## 2019-05-21 DIAGNOSIS — C61 Malignant neoplasm of prostate: Secondary | ICD-10-CM | POA: Diagnosis not present

## 2019-05-22 ENCOUNTER — Ambulatory Visit
Admission: RE | Admit: 2019-05-22 | Discharge: 2019-05-22 | Disposition: A | Discharge status: Home / Self Care | Payer: No Typology Code available for payment source | Source: Ambulatory Visit | Attending: Radiation Oncology | Admitting: Radiation Oncology

## 2019-05-22 ENCOUNTER — Other Ambulatory Visit: Payer: Self-pay | Admitting: Radiation Oncology

## 2019-05-22 ENCOUNTER — Other Ambulatory Visit: Payer: Self-pay

## 2019-05-22 DIAGNOSIS — C61 Malignant neoplasm of prostate: Secondary | ICD-10-CM | POA: Diagnosis not present

## 2019-05-22 MED ORDER — TAMSULOSIN HCL 0.4 MG PO CAPS: 0.4000 mg | ORAL_CAPSULE | Freq: Every day | ORAL | 5 refills | Status: DC

## 2019-05-25 ENCOUNTER — Ambulatory Visit
Admission: RE | Admit: 2019-05-25 | Discharge: 2019-05-25 | Disposition: A | Discharge status: Home / Self Care | Payer: No Typology Code available for payment source | Source: Ambulatory Visit | Attending: Radiation Oncology | Admitting: Radiation Oncology

## 2019-05-25 ENCOUNTER — Other Ambulatory Visit: Payer: Self-pay

## 2019-05-25 DIAGNOSIS — C61 Malignant neoplasm of prostate: Secondary | ICD-10-CM | POA: Diagnosis not present

## 2019-05-26 ENCOUNTER — Ambulatory Visit
Admission: RE | Admit: 2019-05-26 | Discharge: 2019-05-26 | Disposition: A | Discharge status: Home / Self Care | Payer: No Typology Code available for payment source | Source: Ambulatory Visit | Attending: Radiation Oncology | Admitting: Radiation Oncology

## 2019-05-26 ENCOUNTER — Other Ambulatory Visit: Payer: Self-pay

## 2019-05-26 DIAGNOSIS — C61 Malignant neoplasm of prostate: Secondary | ICD-10-CM | POA: Diagnosis not present

## 2019-05-27 ENCOUNTER — Other Ambulatory Visit: Payer: Self-pay

## 2019-05-27 ENCOUNTER — Ambulatory Visit
Admission: RE | Admit: 2019-05-27 | Discharge: 2019-05-27 | Disposition: A | Discharge status: Home / Self Care | Payer: No Typology Code available for payment source | Source: Ambulatory Visit | Attending: Radiation Oncology | Admitting: Radiation Oncology

## 2019-05-27 DIAGNOSIS — C61 Malignant neoplasm of prostate: Secondary | ICD-10-CM | POA: Diagnosis not present

## 2019-05-28 ENCOUNTER — Other Ambulatory Visit: Payer: Self-pay

## 2019-05-28 ENCOUNTER — Ambulatory Visit
Admission: RE | Admit: 2019-05-28 | Discharge: 2019-05-28 | Disposition: A | Discharge status: Home / Self Care | Payer: No Typology Code available for payment source | Source: Ambulatory Visit | Attending: Radiation Oncology | Admitting: Radiation Oncology

## 2019-05-28 DIAGNOSIS — C61 Malignant neoplasm of prostate: Secondary | ICD-10-CM | POA: Diagnosis not present

## 2019-05-29 ENCOUNTER — Ambulatory Visit
Admission: RE | Admit: 2019-05-29 | Discharge: 2019-05-29 | Disposition: A | Discharge status: Home / Self Care | Payer: No Typology Code available for payment source | Source: Ambulatory Visit | Attending: Radiation Oncology | Admitting: Radiation Oncology

## 2019-05-29 ENCOUNTER — Other Ambulatory Visit: Payer: Self-pay

## 2019-05-29 DIAGNOSIS — C61 Malignant neoplasm of prostate: Secondary | ICD-10-CM | POA: Diagnosis not present

## 2019-06-01 ENCOUNTER — Other Ambulatory Visit: Payer: Self-pay

## 2019-06-01 ENCOUNTER — Ambulatory Visit
Admission: RE | Admit: 2019-06-01 | Discharge: 2019-06-01 | Disposition: A | Discharge status: Home / Self Care | Payer: No Typology Code available for payment source | Source: Ambulatory Visit | Attending: Radiation Oncology | Admitting: Radiation Oncology

## 2019-06-01 DIAGNOSIS — C61 Malignant neoplasm of prostate: Secondary | ICD-10-CM | POA: Diagnosis not present

## 2019-06-02 ENCOUNTER — Other Ambulatory Visit: Payer: Self-pay

## 2019-06-02 ENCOUNTER — Encounter: Payer: Self-pay | Admitting: Medical Oncology

## 2019-06-02 ENCOUNTER — Ambulatory Visit
Admission: RE | Admit: 2019-06-02 | Discharge: 2019-06-02 | Disposition: A | Discharge status: Home / Self Care | Payer: No Typology Code available for payment source | Source: Ambulatory Visit | Attending: Radiation Oncology | Admitting: Radiation Oncology

## 2019-06-02 DIAGNOSIS — C61 Malignant neoplasm of prostate: Secondary | ICD-10-CM | POA: Diagnosis not present

## 2019-06-03 ENCOUNTER — Ambulatory Visit
Admission: RE | Admit: 2019-06-03 | Discharge: 2019-06-03 | Disposition: A | Discharge status: Home / Self Care | Payer: No Typology Code available for payment source | Source: Ambulatory Visit | Attending: Radiation Oncology | Admitting: Radiation Oncology

## 2019-06-03 ENCOUNTER — Other Ambulatory Visit: Payer: Self-pay

## 2019-06-03 DIAGNOSIS — C61 Malignant neoplasm of prostate: Secondary | ICD-10-CM | POA: Diagnosis not present

## 2019-06-04 ENCOUNTER — Other Ambulatory Visit: Payer: Self-pay

## 2019-06-04 ENCOUNTER — Ambulatory Visit
Admission: RE | Admit: 2019-06-04 | Discharge: 2019-06-04 | Disposition: A | Discharge status: Home / Self Care | Payer: No Typology Code available for payment source | Source: Ambulatory Visit | Attending: Radiation Oncology | Admitting: Radiation Oncology

## 2019-06-04 DIAGNOSIS — C61 Malignant neoplasm of prostate: Secondary | ICD-10-CM | POA: Insufficient documentation

## 2019-06-05 ENCOUNTER — Ambulatory Visit
Admission: RE | Admit: 2019-06-05 | Discharge: 2019-06-05 | Disposition: A | Discharge status: Home / Self Care | Payer: No Typology Code available for payment source | Source: Ambulatory Visit | Attending: Radiation Oncology | Admitting: Radiation Oncology

## 2019-06-05 ENCOUNTER — Other Ambulatory Visit: Payer: Self-pay

## 2019-06-05 DIAGNOSIS — C61 Malignant neoplasm of prostate: Secondary | ICD-10-CM | POA: Diagnosis not present

## 2019-06-08 ENCOUNTER — Ambulatory Visit
Admission: RE | Admit: 2019-06-08 | Discharge: 2019-06-08 | Disposition: A | Discharge status: Home / Self Care | Payer: No Typology Code available for payment source | Source: Ambulatory Visit | Attending: Radiation Oncology | Admitting: Radiation Oncology

## 2019-06-08 ENCOUNTER — Other Ambulatory Visit: Payer: Self-pay

## 2019-06-08 ENCOUNTER — Telehealth: Payer: Self-pay | Admitting: Radiation Oncology

## 2019-06-08 DIAGNOSIS — C61 Malignant neoplasm of prostate: Secondary | ICD-10-CM | POA: Diagnosis not present

## 2019-06-08 NOTE — Telephone Encounter (Signed)
Patient can be difficult to understand at times thus phoned Justice Rocher, friend who assist patient with transportation, for clarification. Learned from therapist on L1  that patient mentioned COVID and exposure. Justice Rocher explains that her aunt recently passed away at the age of 35. She explains that last Tuesday before her aunt passed she was admitted to the hospital with dehydration and later found to have COVID. Justice Rocher confirms she hasn't been around her aunt. 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 Justice Rocher has decided to self quarantine and has not been around Mr. Braam. Informed therapist on L1 of this finding.

## 2019-06-09 ENCOUNTER — Ambulatory Visit
Admission: RE | Admit: 2019-06-09 | Discharge: 2019-06-09 | Disposition: A | Discharge status: Home / Self Care | Payer: No Typology Code available for payment source | Source: Ambulatory Visit | Attending: Radiation Oncology | Admitting: Radiation Oncology

## 2019-06-09 ENCOUNTER — Other Ambulatory Visit: Payer: Self-pay

## 2019-06-09 DIAGNOSIS — C61 Malignant neoplasm of prostate: Secondary | ICD-10-CM | POA: Diagnosis not present

## 2019-06-10 ENCOUNTER — Other Ambulatory Visit: Payer: Self-pay

## 2019-06-10 ENCOUNTER — Ambulatory Visit
Admission: RE | Admit: 2019-06-10 | Discharge: 2019-06-10 | Disposition: A | Discharge status: Home / Self Care | Payer: No Typology Code available for payment source | Source: Ambulatory Visit | Attending: Radiation Oncology | Admitting: Radiation Oncology

## 2019-06-10 DIAGNOSIS — C61 Malignant neoplasm of prostate: Secondary | ICD-10-CM | POA: Diagnosis not present

## 2019-06-11 ENCOUNTER — Other Ambulatory Visit: Payer: Self-pay

## 2019-06-11 ENCOUNTER — Ambulatory Visit
Admission: RE | Admit: 2019-06-11 | Discharge: 2019-06-11 | Disposition: A | Discharge status: Home / Self Care | Payer: No Typology Code available for payment source | Source: Ambulatory Visit | Attending: Radiation Oncology | Admitting: Radiation Oncology

## 2019-06-11 DIAGNOSIS — C61 Malignant neoplasm of prostate: Secondary | ICD-10-CM | POA: Diagnosis not present

## 2019-06-12 ENCOUNTER — Other Ambulatory Visit: Payer: Self-pay

## 2019-06-12 ENCOUNTER — Ambulatory Visit
Admission: RE | Admit: 2019-06-12 | Discharge: 2019-06-12 | Disposition: A | Discharge status: Home / Self Care | Payer: No Typology Code available for payment source | Source: Ambulatory Visit | Attending: Radiation Oncology | Admitting: Radiation Oncology

## 2019-06-12 DIAGNOSIS — C61 Malignant neoplasm of prostate: Secondary | ICD-10-CM | POA: Diagnosis not present

## 2019-06-15 ENCOUNTER — Ambulatory Visit
Admission: RE | Admit: 2019-06-15 | Discharge: 2019-06-15 | Disposition: A | Discharge status: Home / Self Care | Payer: No Typology Code available for payment source | Source: Ambulatory Visit | Attending: Radiation Oncology | Admitting: Radiation Oncology

## 2019-06-15 ENCOUNTER — Other Ambulatory Visit: Payer: Self-pay

## 2019-06-15 DIAGNOSIS — C61 Malignant neoplasm of prostate: Secondary | ICD-10-CM | POA: Diagnosis not present

## 2019-06-16 ENCOUNTER — Ambulatory Visit
Admission: RE | Admit: 2019-06-16 | Discharge: 2019-06-16 | Disposition: A | Discharge status: Home / Self Care | Payer: No Typology Code available for payment source | Source: Ambulatory Visit | Attending: Radiation Oncology | Admitting: Radiation Oncology

## 2019-06-16 ENCOUNTER — Other Ambulatory Visit: Payer: Self-pay

## 2019-06-16 DIAGNOSIS — C61 Malignant neoplasm of prostate: Secondary | ICD-10-CM | POA: Diagnosis not present

## 2019-06-17 ENCOUNTER — Ambulatory Visit
Admission: RE | Admit: 2019-06-17 | Discharge: 2019-06-17 | Disposition: A | Discharge status: Home / Self Care | Payer: No Typology Code available for payment source | Source: Ambulatory Visit | Attending: Radiation Oncology | Admitting: Radiation Oncology

## 2019-06-17 ENCOUNTER — Other Ambulatory Visit: Payer: Self-pay

## 2019-06-17 DIAGNOSIS — C61 Malignant neoplasm of prostate: Secondary | ICD-10-CM | POA: Diagnosis not present

## 2019-06-18 ENCOUNTER — Other Ambulatory Visit: Payer: Self-pay

## 2019-06-18 ENCOUNTER — Ambulatory Visit
Admission: RE | Admit: 2019-06-18 | Discharge: 2019-06-18 | Disposition: A | Discharge status: Home / Self Care | Payer: No Typology Code available for payment source | Source: Ambulatory Visit | Attending: Radiation Oncology | Admitting: Radiation Oncology

## 2019-06-18 DIAGNOSIS — C61 Malignant neoplasm of prostate: Secondary | ICD-10-CM | POA: Diagnosis not present

## 2019-06-19 ENCOUNTER — Other Ambulatory Visit: Payer: Self-pay

## 2019-06-19 ENCOUNTER — Ambulatory Visit
Admission: RE | Admit: 2019-06-19 | Discharge: 2019-06-19 | Disposition: A | Discharge status: Home / Self Care | Payer: No Typology Code available for payment source | Source: Ambulatory Visit | Attending: Radiation Oncology | Admitting: Radiation Oncology

## 2019-06-19 DIAGNOSIS — C61 Malignant neoplasm of prostate: Secondary | ICD-10-CM | POA: Diagnosis not present

## 2019-06-22 ENCOUNTER — Ambulatory Visit
Admission: RE | Admit: 2019-06-22 | Discharge: 2019-06-22 | Disposition: A | Discharge status: Home / Self Care | Payer: No Typology Code available for payment source | Source: Ambulatory Visit | Attending: Radiation Oncology | Admitting: Radiation Oncology

## 2019-06-22 ENCOUNTER — Other Ambulatory Visit: Payer: Self-pay

## 2019-06-22 DIAGNOSIS — C61 Malignant neoplasm of prostate: Secondary | ICD-10-CM | POA: Diagnosis not present

## 2019-06-23 ENCOUNTER — Other Ambulatory Visit: Payer: Self-pay

## 2019-06-23 ENCOUNTER — Ambulatory Visit
Admission: RE | Admit: 2019-06-23 | Discharge: 2019-06-23 | Disposition: A | Discharge status: Home / Self Care | Payer: No Typology Code available for payment source | Source: Ambulatory Visit | Attending: Radiation Oncology | Admitting: Radiation Oncology

## 2019-06-23 DIAGNOSIS — C61 Malignant neoplasm of prostate: Secondary | ICD-10-CM | POA: Diagnosis not present

## 2019-06-24 ENCOUNTER — Other Ambulatory Visit: Payer: Self-pay

## 2019-06-24 ENCOUNTER — Ambulatory Visit
Admission: RE | Admit: 2019-06-24 | Discharge: 2019-06-24 | Disposition: A | Discharge status: Home / Self Care | Payer: No Typology Code available for payment source | Source: Ambulatory Visit | Attending: Radiation Oncology | Admitting: Radiation Oncology

## 2019-06-24 DIAGNOSIS — C61 Malignant neoplasm of prostate: Secondary | ICD-10-CM | POA: Diagnosis not present

## 2019-06-25 ENCOUNTER — Other Ambulatory Visit: Payer: Self-pay

## 2019-06-25 ENCOUNTER — Ambulatory Visit
Admission: RE | Admit: 2019-06-25 | Discharge: 2019-06-25 | Disposition: A | Discharge status: Home / Self Care | Payer: No Typology Code available for payment source | Source: Ambulatory Visit | Attending: Radiation Oncology | Admitting: Radiation Oncology

## 2019-06-25 DIAGNOSIS — C61 Malignant neoplasm of prostate: Secondary | ICD-10-CM | POA: Diagnosis not present

## 2019-06-26 ENCOUNTER — Ambulatory Visit
Admission: RE | Admit: 2019-06-26 | Discharge: 2019-06-26 | Disposition: A | Discharge status: Home / Self Care | Payer: No Typology Code available for payment source | Source: Ambulatory Visit | Attending: Radiation Oncology | Admitting: Radiation Oncology

## 2019-06-26 ENCOUNTER — Other Ambulatory Visit: Payer: Self-pay

## 2019-06-26 DIAGNOSIS — C61 Malignant neoplasm of prostate: Secondary | ICD-10-CM | POA: Diagnosis not present

## 2019-06-29 ENCOUNTER — Ambulatory Visit
Admission: RE | Admit: 2019-06-29 | Discharge: 2019-06-29 | Disposition: A | Discharge status: Home / Self Care | Payer: No Typology Code available for payment source | Source: Ambulatory Visit | Attending: Radiation Oncology | Admitting: Radiation Oncology

## 2019-06-29 ENCOUNTER — Other Ambulatory Visit: Payer: Self-pay

## 2019-06-29 DIAGNOSIS — C61 Malignant neoplasm of prostate: Secondary | ICD-10-CM | POA: Diagnosis not present

## 2019-06-30 ENCOUNTER — Other Ambulatory Visit: Payer: Self-pay

## 2019-06-30 ENCOUNTER — Ambulatory Visit
Admission: RE | Admit: 2019-06-30 | Discharge: 2019-06-30 | Disposition: A | Discharge status: Home / Self Care | Payer: No Typology Code available for payment source | Source: Ambulatory Visit | Attending: Radiation Oncology | Admitting: Radiation Oncology

## 2019-06-30 DIAGNOSIS — C61 Malignant neoplasm of prostate: Secondary | ICD-10-CM | POA: Diagnosis not present

## 2019-07-01 ENCOUNTER — Ambulatory Visit
Admission: RE | Admit: 2019-07-01 | Discharge: 2019-07-01 | Disposition: A | Discharge status: Home / Self Care | Payer: No Typology Code available for payment source | Source: Ambulatory Visit | Attending: Radiation Oncology | Admitting: Radiation Oncology

## 2019-07-01 ENCOUNTER — Other Ambulatory Visit: Payer: Self-pay

## 2019-07-01 DIAGNOSIS — C61 Malignant neoplasm of prostate: Secondary | ICD-10-CM | POA: Diagnosis not present

## 2019-07-02 ENCOUNTER — Encounter: Payer: Self-pay | Admitting: Medical Oncology

## 2019-07-02 ENCOUNTER — Encounter: Payer: Self-pay | Admitting: Radiation Oncology

## 2019-07-02 ENCOUNTER — Ambulatory Visit
Admission: RE | Admit: 2019-07-02 | Discharge: 2019-07-02 | Disposition: A | Discharge status: Home / Self Care | Payer: No Typology Code available for payment source | Source: Ambulatory Visit | Attending: Radiation Oncology | Admitting: Radiation Oncology

## 2019-07-02 ENCOUNTER — Other Ambulatory Visit: Payer: Self-pay

## 2019-07-02 DIAGNOSIS — C61 Malignant neoplasm of prostate: Secondary | ICD-10-CM | POA: Diagnosis not present

## 2019-07-02 NOTE — Progress Notes (Signed)
Celebrated with Jimmy Conner as the rang the bell after completing radiation treatments. He states he has a follow up appointment in 2 weeks. After he left, I looked to confirm date, and I do not see a follow up scheduled. I have asked [name] to follow up on this appointment.  I encouraged him to call with questions or concerns.

## 2019-07-28 ENCOUNTER — Encounter: Payer: Self-pay | Admitting: Urology

## 2019-07-28 ENCOUNTER — Other Ambulatory Visit: Payer: Self-pay

## 2019-07-29 ENCOUNTER — Ambulatory Visit
Admission: RE | Admit: 2019-07-29 | Discharge: 2019-07-29 | Disposition: A | Discharge status: Home / Self Care | Payer: Medicare Other | Source: Ambulatory Visit | Attending: Urology | Admitting: Urology

## 2019-07-29 DIAGNOSIS — C61 Malignant neoplasm of prostate: Secondary | ICD-10-CM

## 2019-07-29 NOTE — Progress Notes (Signed)
Radiation Oncology         [phone] ________________________________  Name: Jimmy Conner MRN: [id number]  Date: 07/29/2019  DOB: 07-22-1946  Post Treatment Note  CC: Clinic, [facility]  Diagnosis:   73 y.o. gentleman with Stage T1c adenocarcinoma of the prostate with Gleason score of 4+3, and PSA of 81.8.  Interval Since Last Radiation:  4 weeks  05/07/19 - 07/02/19:  The prostate and pelvic nodes were treated to 45 Gy in 25 fractions of 1.8 Gy, followed by a boost to the prostate to a total dose of 75 Gy with 15 additional fractions of 2.0 Gy; concurrent with ADT.   Narrative:  I spoke with the patient to conduct his routine scheduled 71 month follow up visit via telephone to spare the patient unnecessary potential exposure in the healthcare setting during the current COVID-19 pandemic.  The patient was notified in advance and gave permission to proceed with this visit format.  He tolerated the radiation treatment relatively well with only minor urinary bother.  He reported nocturia x3/night and occasional loose stools.  He continued to tolerate the ADT well throughout treatment with only occasional hot flashes and mild fatigue.                              On review of systems, the patient states that he is doing very well overall.  He reports that his LUTS have almost completely resolved back to his baseline and he is now only getting up once per night to urinate.  He specifically denies dysuria, gross hematuria, excessive daytime frequency, urgency, weak stream, straining to void, incomplete bladder emptying or incontinence.  He reports a healthy appetite and is maintaining his weight.  He continues with mild but tolerable hot flashes and fatigue but has remained active and tolerates the ADT well.  He denies any abdominal pain, nausea, vomiting, diarrhea or constipation.  Overall, he is quite pleased with his progress to date.  ALLERGIES:  is allergic to  hydrocodone-acetaminophen.  Meds: Current Outpatient Medications  Medication Sig Dispense Refill  . abacavir-dolutegravir-lamiVUDine (TRIUMEQ) 600-50-300 MG tablet Take 1 tablet by mouth daily.    Marland Kitchen amLODipine (NORVASC) 5 MG tablet Take 5 mg by mouth daily.    Marland Kitchen sulfamethoxazole-trimethoprim (BACTRIM DS,SEPTRA DS) 800-160 MG tablet Take 1 tablet by mouth every Monday, Wednesday, and Friday.    . Tenofovir Alafenamide Fumarate 25 MG TABS Take 1 tablet by mouth daily.    . tamsulosin (FLOMAX) 0.4 MG CAPS capsule Take 1 capsule (0.4 mg total) by mouth daily after supper. (Patient not taking: Reported on 07/28/2019) 30 capsule 5   No current facility-administered medications for this encounter.     Physical Findings:  vitals were not taken for this visit.   /Unable to assess due to telephone follow-up visit format.  Lab Findings: Lab Results  Component Value Date   WBC 3.7 (L) 03/04/2018   HGB 11.0 (L) 03/04/2018   HCT 32.5 (L) 03/04/2018   MCV 86.7 03/04/2018   PLT 205 03/04/2018     Radiographic Findings: No results found.  Impression/Plan: 1. 73 y.o. gentleman with Stage T1c adenocarcinoma of the prostate with Gleason score of 4+3, and PSA of 81.8.   He will continue to follow up with urology at the [facility] for ongoing PSA determinations.  He does not currently have a follow up appointment scheduled with Dr. Neita Carp to his knowledge.  I will forward a summary of his treatment to Dr. Neita Carp so that his medical team at the [facility] is aware that he has completed his radiation treatment and advised him to call for a follow up in the next 2-3 months for repeat PSA.  He understands what to expect with regards to PSA monitoring going forward. I will look forward to following his response to treatment via correspondence with urology, and would be happy to continue to participate in his care if clinically indicated. I talked to the patient about what to expect in the future, including his risk for  erectile dysfunction and rectal bleeding. I encouraged him to call or return to the office if he has any questions regarding his previous radiation or possible radiation side effects. He was comfortable with this plan and will follow up as needed.    Nicholos Johns, PA-C

## 2019-08-23 NOTE — Progress Notes (Signed)
  Radiation Oncology         [phone] ________________________________  Name: Jimmy Conner MRN: [id number]  Date: 07/02/2019  DOB: 02-20-1946  End of Treatment Note  Diagnosis:   73 y.o. gentleman with Stage T1c adenocarcinoma of the prostate with Gleason score of 4+3, and PSA of 81.8     Indication for treatment:  Curative, Definitive Radiotherapy       Radiation treatment dates:   05/07/19-07/02/19  Site/dose:  1. The prostate, seminal vesicles, and pelvic lymph nodes were initially treated to 45 Gy in 25 fractions of 1.8 Gy  2. The prostate only was boosted to 75 Gy with 15 additional fractions of 2.0 Gy   Beams/energy:  1. The prostate, seminal vesicles, and pelvic lymph nodes were initially treated using VMAT intensity modulated radiotherapy delivering 6 megavolt photons. Image guidance was performed with CB-CT studies prior to each fraction. He was immobilized with a body fix lower extremity mold.  2. the prostate only was boosted using VMAT intensity modulated radiotherapy delivering 6 megavolt photons. Image guidance was performed with CB-CT studies prior to each fraction. He was immobilized with a body fix lower extremity mold.  Narrative: The patient tolerated radiation treatment relatively well.   The patient experienced some minor urinary irritation and modest fatigue.    Plan: The patient has completed radiation treatment. He will return to radiation oncology clinic for routine followup in one month. I advised him to call or return sooner if he has any questions or concerns related to his recovery or treatment. ________________________________  Sheral Apley. Tammi Klippel, M.D.

## 2019-10-02 ENCOUNTER — Other Ambulatory Visit: Payer: Self-pay

## 2019-10-02 ENCOUNTER — Emergency Department (HOSPITAL_BASED_OUTPATIENT_CLINIC_OR_DEPARTMENT_OTHER): Payer: No Typology Code available for payment source

## 2019-10-02 ENCOUNTER — Encounter (HOSPITAL_BASED_OUTPATIENT_CLINIC_OR_DEPARTMENT_OTHER): Payer: Self-pay

## 2019-10-02 ENCOUNTER — Emergency Department (HOSPITAL_BASED_OUTPATIENT_CLINIC_OR_DEPARTMENT_OTHER)
Admission: EM | Admit: 2019-10-02 | Discharge: 2019-10-02 | Disposition: A | Discharge status: Home / Self Care | Payer: No Typology Code available for payment source | Attending: Emergency Medicine | Admitting: Emergency Medicine

## 2019-10-02 DIAGNOSIS — Y999 Unspecified external cause status: Secondary | ICD-10-CM | POA: Diagnosis not present

## 2019-10-02 DIAGNOSIS — S76212A Strain of adductor muscle, fascia and tendon of left thigh, initial encounter: Secondary | ICD-10-CM | POA: Diagnosis not present

## 2019-10-02 DIAGNOSIS — Z885 Allergy status to narcotic agent status: Secondary | ICD-10-CM | POA: Insufficient documentation

## 2019-10-02 DIAGNOSIS — Z8546 Personal history of malignant neoplasm of prostate: Secondary | ICD-10-CM | POA: Insufficient documentation

## 2019-10-02 DIAGNOSIS — B20 Human immunodeficiency virus [HIV] disease: Secondary | ICD-10-CM | POA: Diagnosis not present

## 2019-10-02 DIAGNOSIS — Z79899 Other long term (current) drug therapy: Secondary | ICD-10-CM | POA: Insufficient documentation

## 2019-10-02 DIAGNOSIS — Y929 Unspecified place or not applicable: Secondary | ICD-10-CM | POA: Insufficient documentation

## 2019-10-02 DIAGNOSIS — I10 Essential (primary) hypertension: Secondary | ICD-10-CM | POA: Insufficient documentation

## 2019-10-02 DIAGNOSIS — X58XXXA Exposure to other specified factors, initial encounter: Secondary | ICD-10-CM | POA: Diagnosis not present

## 2019-10-02 DIAGNOSIS — Y939 Activity, unspecified: Secondary | ICD-10-CM | POA: Insufficient documentation

## 2019-10-02 DIAGNOSIS — S79912A Unspecified injury of left hip, initial encounter: Secondary | ICD-10-CM | POA: Diagnosis present

## 2019-10-02 MED ORDER — KETOROLAC TROMETHAMINE 60 MG/2ML IM SOLN
60.0000 mg | Freq: Once | INTRAMUSCULAR | Status: AC
  Administered 2019-10-02: 60 mg via INTRAMUSCULAR
  Filled 2019-10-02: qty 2

## 2019-10-02 MED ORDER — OXYCODONE-ACETAMINOPHEN 5-325 MG PO TABS
2.0000 | ORAL_TABLET | Freq: Once | ORAL | Status: AC
  Administered 2019-10-02: 2 via ORAL
  Filled 2019-10-02: qty 2

## 2019-10-02 MED ORDER — ORPHENADRINE CITRATE ER 100 MG PO TB12: 100.0000 mg | ORAL_TABLET | Freq: Two times a day (BID) | ORAL | 0 refills | Status: DC

## 2019-10-02 MED ORDER — HYDROMORPHONE HCL 1 MG/ML IJ SOLN
0.5000 mg | Freq: Once | INTRAMUSCULAR | Status: AC
  Administered 2019-10-02: 0.5 mg via INTRAMUSCULAR
  Filled 2019-10-02: qty 1

## 2019-10-02 MED ORDER — OXYCODONE-ACETAMINOPHEN 5-325 MG PO TABS: 1.0000 | ORAL_TABLET | ORAL | 0 refills | Status: DC | PRN

## 2019-10-02 MED ORDER — NAPROXEN 375 MG PO TABS: 375.0000 mg | ORAL_TABLET | Freq: Two times a day (BID) | ORAL | 0 refills | Status: DC

## 2019-10-02 NOTE — ED Notes (Signed)
Pt taken out in wheelchair, picked up by family

## 2019-10-02 NOTE — ED Provider Notes (Signed)
[facility] EMERGENCY DEPARTMENT Provider Note   CSN: [id number] Arrival date & time: 10/02/19  1203     History Chief Complaint  Patient presents with   Weakness   Hip Pain    Jimmy Conner is a 74 y.o. male.  HPI Patient has severe pain which he perceives to be at the left hip approximately.  He reports he gets pain is so severe when he moves his leg a certain way or tries to bear weight that it makes him buckle a little bit.  On exam is I have him outline where the pain is patient illustrates his inguinal region.  He reports he is also wondered if maybe he had a hernia.  He has not gotten any relief taking plain Tylenol.  Patient reports he does not like to take pain medications.  No injury that he can think of.    Past Medical History:  Diagnosis Date   Family history of colon cancer    Family history of lung cancer    HIV (human immunodeficiency virus infection) ([place])    Hypertension    Immune deficiency disorder ([place])    Personal history of skin cancer    Prostate cancer ([place])    Skin cancer     Patient Active Problem List   Diagnosis Date Noted   Family history of colon cancer    Family history of lung cancer    Malignant neoplasm of prostate ([place]) 10/15/2018   UTI (urinary tract infection) 03/03/2018   Status post placement of ureteral stent 03/03/2018   HIV (human immunodeficiency virus infection) ([place]) 03/03/2018   HTN (hypertension) 03/03/2018   AKI (acute kidney injury) ([place]) 03/03/2018   Personal history of other malignant neoplasm of skin 08/05/2014    Past Surgical History:  Procedure Laterality Date   CHOLECYSTECTOMY     PROSTATE BIOPSY     SPLENECTOMY         Family History  Problem Relation Age of Onset   Prostate cancer Cousin        not biologically related   Colon cancer Maternal Uncle 61   Lung cancer Paternal Uncle        diagnosed in his 76s, smoker    Social History   Tobacco Use   Smoking  status: Never Smoker   Smokeless tobacco: Never Used  Substance Use Topics   Alcohol use: No   Drug use: Never    Home Medications Prior to Admission medications   Medication Sig Start Date End Date Taking? Authorizing Provider  abacavir-dolutegravir-lamiVUDine (TRIUMEQ) 600-50-300 MG tablet Take 1 tablet by mouth daily.    [provider]  amLODipine (NORVASC) 5 MG tablet Take 5 mg by mouth daily.    [provider]  naproxen (NAPROSYN) 375 MG tablet Take 1 tablet (375 mg total) by mouth 2 (two) times daily. 10/02/19   Charlesetta Shanks, MD  orphenadrine (NORFLEX) 100 MG tablet Take 1 tablet (100 mg total) by mouth 2 (two) times daily. 10/02/19   Charlesetta Shanks, MD  oxyCODONE-acetaminophen (PERCOCET) 5-325 MG tablet Take 1-2 tablets by mouth every 4 (four) hours as needed. 10/02/19   Charlesetta Shanks, MD  sulfamethoxazole-trimethoprim (BACTRIM DS,SEPTRA DS) 800-160 MG tablet Take 1 tablet by mouth every Monday, Wednesday, and Friday.    [provider]  tamsulosin (FLOMAX) 0.4 MG CAPS capsule Take 1 capsule (0.4 mg total) by mouth daily after supper. Patient not taking: Reported on 07/28/2019 05/22/19   Tyler Pita, MD  Tenofovir  Alafenamide Fumarate 25 MG TABS Take 1 tablet by mouth daily.    [provider]    Allergies    Hydrocodone-acetaminophen  Review of Systems   Review of Systems 10 Systems reviewed and are negative for acute change except as noted in the HPI. Physical Exam Updated Vital Signs BP (!) 157/83 (BP Location: Right Arm)    Pulse 62    Resp 18    SpO2 98%   Physical Exam Constitutional:      Comments: Patient is alert and clinically well in appearance.  No respiratory distress.  HENT:     Head: Normocephalic and atraumatic.  Eyes:     Extraocular Movements: Extraocular movements intact.  Cardiovascular:     Rate and Rhythm: Normal rate and regular rhythm.  Pulmonary:     Effort: Pulmonary effort is normal.     Breath  sounds: Normal breath sounds.  Abdominal:     General: There is no distension.     Palpations: Abdomen is soft.     Tenderness: There is no abdominal tenderness. There is no guarding.     Comments: Patient is exquisitely tender right along the inguinal ligament.  There is no mass or fullness either above or below the ligament.  No lymphadenopathy.  Femoral pulses 2+ and strong.  Soft tissues are otherwise normal.  Musculoskeletal:     Comments: Bilateral lower extremities are symmetric.  No peripheral edema.  Good general musculature and condition of the lower extremities.  Patient has some mild discomfort over the left trochanter but pain is really localized to the inguinal ligament.  In a standing position patient is stable.  He can elevate the left leg off of the floor and stand on the right.  Patient has pain limitation which is exacerbated by extension type maneuvers at the hip flexor.  Skin:    General: Skin is warm and dry.  Neurological:     General: No focal deficit present.     Mental Status: He is oriented to person, place, and time.     Coordination: Coordination normal.  Psychiatric:        Mood and Affect: Mood normal.     ED Results / Procedures / Treatments   Labs (all labs ordered are listed, but only abnormal results are displayed) Labs Reviewed - No data to display  EKG None  Radiology DG Hip Unilat With Pelvis 2-3 Views Left  Result Date: 10/02/2019 CLINICAL DATA:  Hip pain.  No known injury. EXAM: DG HIP (WITH OR WITHOUT PELVIS) 2-3V LEFT COMPARISON:  12/03/2015. FINDINGS: Diffuse osteopenia. Degenerative change lumbar spine, both SI joints, and both hips. No acute bony or joint abnormality identified. Pelvic calcifications consistent phleboliths. IMPRESSION: Diffuse osteopenia. Degenerative changes lumbar spine, both SI joints, both hips. Similar findings noted on prior exam. No acute abnormality identified. Electronically Signed   By: Marcello Moores  Register   On:  10/02/2019 14:53    Procedures Procedures (including critical care time)  Medications Ordered in ED Medications  HYDROmorphone (DILAUDID) injection 0.5 mg (0.5 mg Intramuscular Given 10/02/19 1359)  ketorolac (TORADOL) injection 60 mg (60 mg Intramuscular Given 10/02/19 1557)  oxyCODONE-acetaminophen (PERCOCET/ROXICET) 5-325 MG per tablet 2 tablet (2 tablets Oral Given 10/02/19 1557)    ED Course  I have reviewed the triage vital signs and the nursing notes.  Pertinent labs & imaging results that were available during my care of the patient were reviewed by me and considered in my medical decision making (see chart  for details).    MDM Rules/Calculators/A&P                     X-rays obtained to rule out any lytic type bony lesions.  Patient does have history of prostate cancer.  He has successfully completed treatment several months ago.  No suspicious lesions present.  Physical exam is consistent with pain localizing to the groin.  Soft tissue exam however is normal with no lymphadenopathy, no hernia and normal vascular exam.  At this time will trial naproxen, Percocet and Norflex for pain control.  Return precautions reviewed. Final Clinical Impression(s) / ED Diagnoses Final diagnoses:  Strain of groin, left, initial encounter    Rx / DC Orders ED Discharge Orders         Ordered    naproxen (NAPROSYN) 375 MG tablet  2 times daily     10/02/19 1552    oxyCODONE-acetaminophen (PERCOCET) 5-325 MG tablet  Every 4 hours PRN     10/02/19 1552    orphenadrine (NORFLEX) 100 MG tablet  2 times daily     10/02/19 1552           Charlesetta Shanks, MD 10/02/19 1601

## 2019-10-02 NOTE — Discharge Instructions (Signed)
1.  Your pain is right at the inguinal ligament and top of the abductor muscle.  This typically is due to a groin strain or overuse.  No hernia is present on physical exam. 2.  Use warm moist heat. 3.  You have been prescribed naproxen to take twice daily.  This is an anti-inflammatory to help with pain.  Take your muscle relaxer twice daily as prescribed.  Also, you have been prescribed Percocet.  You may take 1 to 2 tablets every 6 hours for pain not controlled by the other medications.  If you have any tendency to get constipation, take an over-the-counter stool softener to avoid constipation which can occur with narcotic pain medication such as Percocet. 4.  If you are getting weakness of the leg, numbness of the leg, worsening pain or new symptoms, return to the emergency department. 5.  Make an appointment to see your family doctor next week.

## 2019-10-02 NOTE — ED Triage Notes (Addendum)
Pt c/o weakness to left LE x 2-3 days-last night became worse ~6pm-also c/o pain to left hip with no new injury-to triage in w/c-NAD-A/O

## 2019-12-10 ENCOUNTER — Encounter (HOSPITAL_COMMUNITY): Payer: Self-pay

## 2019-12-10 NOTE — Progress Notes (Signed)
Left a voicemail with Judeen Hammans to request per order from Dr. Alvan Dame for epic.

## 2019-12-10 NOTE — Patient Instructions (Signed)
DUE TO COVID-19 ONLY TWO VISITORS ARE ALLOWED TO COME WITH YOU AND STAY IN THE WAITING ROOM ONLY DURING PRE OP AND PROCEDURE. THE TWO VISITORS MAY VISIT WITH YOU IN YOUR PRIVATE ROOM DURING VISITING HOURS ONLY!!   COVID SWAB TESTING MUST BE COMPLETED ON:   Friday, December 11, 2019 at 2:35 PM   [address] [place] [facility] enter pre surgical testing line (Must self quarantine after testing. Follow instructions on handout.)             Your procedure is scheduled on: Tuesday, December 15, 2019   Report to [facility] Main  Entrance    Report to admitting at 11:45 AM   Call this number if you have problems the morning of surgery [phone]   Do not eat food :After Midnight.   May have liquids until 11:15 AM day of surgery   CLEAR LIQUID DIET  Foods Allowed                                                                     Foods Excluded  Water, Black Coffee and tea, regular and decaf                             liquids that you cannot  Plain Jell-O in any flavor  (No red)                                           see through such as: Fruit ices (not with fruit pulp)                                     milk, soups, orange juice  Iced Popsicles (No red)                                    All solid food Carbonated beverages, regular and diet                                    Apple juices Sports drinks like Gatorade (No red) Lightly seasoned clear broth or consume(fat free) Sugar, honey syrup  Sample Menu Breakfast                                Lunch                                     Supper Cranberry juice                    Beef broth                            Chicken  broth Jell-O                                     Grape juice                           Apple juice Coffee or tea                        Jell-O                                      Popsicle                                                Coffee or tea                        Coffee  or tea   Complete one Ensure drink the morning of surgery at 11:15 AM  the day of surgery.   Oral Hygiene is also important to reduce your risk of infection.                                    Remember - BRUSH YOUR TEETH THE MORNING OF SURGERY WITH YOUR REGULAR TOOTHPASTE   Do NOT smoke after Midnight   Take these medicines the morning of surgery with A SIP OF WATER: Triumeq, Amlodipine, Tenofovir                               You may not have any metal on your body including jewelry, and body piercings             Do not wear lotions, powders, perfumes/cologne, or deodorant                         Men may shave face and neck.   Do not bring valuables to the hospital. [place].   Contacts, dentures or bridgework may not be worn into surgery.   Bring small overnight bag day of surgery.    Patients discharged the day of surgery will not be allowed to drive home.   Special Instructions: Bring a copy of your healthcare power of attorney and living will documents         the day of surgery if you haven't scanned them in before.              Please read over the following fact sheets you were given:  [facility] - Preparing for Surgery Before surgery, you can play an important role.  Because skin is not sterile, your skin needs to be as free of germs as possible.  You can reduce the number of germs on your skin by washing with CHG (chlorahexidine gluconate) soap before surgery.  CHG is an antiseptic cleaner which kills germs and bonds with the skin to  continue killing germs even after washing. Please DO NOT use if you have an allergy to CHG or antibacterial soaps.  If your skin becomes reddened/irritated stop using the CHG and inform your nurse when you arrive at Short Stay. Do not shave (including legs and underarms) for at least 48 hours prior to the first CHG shower.  You may shave your face/neck.  Please follow these instructions  carefully:  1.  Shower with CHG Soap the night before surgery and the  morning of surgery.  2.  If you choose to wash your hair, wash your hair first as usual with your normal  shampoo.  3.  After you shampoo, rinse your hair and body thoroughly to remove the shampoo.                             4.  Use CHG as you would any other liquid soap.  You can apply chg directly to the skin and wash.  Gently with a scrungie or clean washcloth.  5.  Apply the CHG Soap to your body ONLY FROM THE NECK DOWN.   Do   not use on face/ open                           Wound or open sores. Avoid contact with eyes, ears mouth and   genitals (private parts).                       Wash face,  Genitals (private parts) with your normal soap.             6.  Wash thoroughly, paying special attention to the area where your    surgery  will be performed.  7.  Thoroughly rinse your body with warm water from the neck down.  8.  DO NOT shower/wash with your normal soap after using and rinsing off the CHG Soap.                9.  Pat yourself dry with a clean towel.            10.  Wear clean pajamas.            11.  Place clean sheets on your bed the night of your first shower and do not  sleep with pets. Day of Surgery : Do not apply any lotions/deodorants the morning of surgery.  Please wear clean clothes to the hospital/surgery center.  FAILURE TO FOLLOW THESE INSTRUCTIONS MAY RESULT IN THE CANCELLATION OF YOUR SURGERY  PATIENT SIGNATURE_________________________________  NURSE SIGNATURE__________________________________  ________________________________________________________________________   Jimmy Conner  An incentive spirometer is a tool that can help keep your lungs clear and active. This tool measures how well you are filling your lungs with each breath. Taking long deep breaths may help reverse or decrease the chance of developing breathing (pulmonary) problems (especially infection) following:  A  long period of time when you are unable to move or be active. BEFORE THE PROCEDURE   If the spirometer includes an indicator to show your best effort, your nurse or respiratory therapist will set it to a desired goal.  If possible, sit up straight or lean slightly forward. Try not to slouch.  Hold the incentive spirometer in an upright position. INSTRUCTIONS FOR USE  1. Sit on the edge of your bed if possible, or sit up as far  as you can in bed or on a chair. 2. Hold the incentive spirometer in an upright position. 3. Breathe out normally. 4. Place the mouthpiece in your mouth and seal your lips tightly around it. 5. Breathe in slowly and as deeply as possible, raising the piston or the ball toward the top of the column. 6. Hold your breath for 3-5 seconds or for as long as possible. Allow the piston or ball to fall to the bottom of the column. 7. Remove the mouthpiece from your mouth and breathe out normally. 8. Rest for a few seconds and repeat Steps 1 through 7 at least 10 times every 1-2 hours when you are awake. Take your time and take a few normal breaths between deep breaths. 9. The spirometer may include an indicator to show your best effort. Use the indicator as a goal to work toward during each repetition. 10. After each set of 10 deep breaths, practice coughing to be sure your lungs are clear. If you have an incision (the cut made at the time of surgery), support your incision when coughing by placing a pillow or rolled up towels firmly against it. Once you are able to get out of bed, walk around indoors and cough well. You may stop using the incentive spirometer when instructed by your caregiver.  RISKS AND COMPLICATIONS  Take your time so you do not get dizzy or light-headed.  If you are in pain, you may need to take or ask for pain medication before doing incentive spirometry. It is harder to take a deep breath if you are having pain. AFTER USE  Rest and breathe slowly and  easily.  It can be helpful to keep track of a log of your progress. Your caregiver can provide you with a simple table to help with this. If you are using the spirometer at home, follow these instructions: [place] IF:   You are having difficultly using the spirometer.  You have trouble using the spirometer as often as instructed.  Your pain medication is not giving enough relief while using the spirometer.  You develop fever of 100.5 F (38.1 C) or higher. SEEK IMMEDIATE MEDICAL CARE IF:   You cough up bloody sputum that had not been present before.  You develop fever of 102 F (38.9 C) or greater.  You develop worsening pain at or near the incision site. MAKE SURE YOU:   Understand these instructions.  Will watch your condition.  Will get help right away if you are not doing well or get worse. Document Released: 12/31/2006 Document Revised: 11/12/2011 Document Reviewed: 03/03/2007 ExitCare Patient Information 2014 ExitCare, [place].   ________________________________________________________________________  WHAT IS A BLOOD TRANSFUSION? Blood Transfusion Information  A transfusion is the replacement of blood or some of its parts. Blood is made up of multiple cells which provide different functions.  Red blood cells carry oxygen and are used for blood loss replacement.  White blood cells fight against infection.  Platelets control bleeding.  Plasma helps clot blood.  Other blood products are available for specialized needs, such as hemophilia or other clotting disorders. BEFORE THE TRANSFUSION  Who gives blood for transfusions?   Healthy volunteers who are fully evaluated to make sure their blood is safe. This is blood bank blood. Transfusion therapy is the safest it has ever been in the practice of medicine. Before blood is taken from a donor, a complete history is taken to make sure that person has no history of diseases  nor engages in risky social  behavior (examples are intravenous drug use or sexual activity with multiple partners). The donor's travel history is screened to minimize risk of transmitting infections, such as malaria. The donated blood is tested for signs of infectious diseases, such as HIV and hepatitis. The blood is then tested to be sure it is compatible with you in order to minimize the chance of a transfusion reaction. If you or a relative donates blood, this is often done in anticipation of surgery and is not appropriate for emergency situations. It takes many days to process the donated blood. RISKS AND COMPLICATIONS Although transfusion therapy is very safe and saves many lives, the main dangers of transfusion include:   Getting an infectious disease.  Developing a transfusion reaction. This is an allergic reaction to something in the blood you were given. Every precaution is taken to prevent this. The decision to have a blood transfusion has been considered carefully by your caregiver before blood is given. Blood is not given unless the benefits outweigh the risks. AFTER THE TRANSFUSION  Right after receiving a blood transfusion, you will usually feel much better and more energetic. This is especially true if your red blood cells have gotten low (anemic). The transfusion raises the level of the red blood cells which carry oxygen, and this usually causes an energy increase.  The nurse administering the transfusion will monitor you carefully for complications. HOME CARE INSTRUCTIONS  No special instructions are needed after a transfusion. You may find your energy is better. Speak with your caregiver about any limitations on activity for underlying diseases you may have. SEEK MEDICAL CARE IF:   Your condition is not improving after your transfusion.  You develop redness or irritation at the intravenous (IV) site. SEEK IMMEDIATE MEDICAL CARE IF:  Any of the following symptoms occur over the next 12 hours:  Shaking  chills.  You have a temperature by mouth above 102 F (38.9 C), not controlled by medicine.  Chest, back, or muscle pain.  People around you feel you are not acting correctly or are confused.  Shortness of breath or difficulty breathing.  Dizziness and fainting.  You get a rash or develop hives.  You have a decrease in urine output.  Your urine turns a dark color or changes to pink, red, or brown. Any of the following symptoms occur over the next 10 days:  You have a temperature by mouth above 102 F (38.9 C), not controlled by medicine.  Shortness of breath.  Weakness after normal activity.  The white part of the eye turns yellow (jaundice).  You have a decrease in the amount of urine or are urinating less often.  Your urine turns a dark color or changes to pink, red, or brown. Document Released: 08/17/2000 Document Revised: 11/12/2011 Document Reviewed: 04/05/2008 [facility] Patient Information 2014 [place], [place].  _______________________________________________________________________

## 2019-12-11 ENCOUNTER — Other Ambulatory Visit (HOSPITAL_COMMUNITY)
Admission: RE | Admit: 2019-12-11 | Discharge: 2019-12-11 | Disposition: A | Discharge status: Home / Self Care | Payer: Medicare Other | Source: Ambulatory Visit | Attending: Orthopedic Surgery | Admitting: Orthopedic Surgery

## 2019-12-11 ENCOUNTER — Encounter (HOSPITAL_COMMUNITY)
Admission: RE | Admit: 2019-12-11 | Discharge: 2019-12-11 | Disposition: A | Discharge status: Home / Self Care | Payer: Medicare Other | Source: Ambulatory Visit | Attending: Orthopedic Surgery | Admitting: Orthopedic Surgery

## 2019-12-11 ENCOUNTER — Encounter (HOSPITAL_COMMUNITY): Payer: Self-pay

## 2019-12-11 ENCOUNTER — Other Ambulatory Visit: Payer: Self-pay

## 2019-12-11 DIAGNOSIS — Z01818 Encounter for other preprocedural examination: Secondary | ICD-10-CM | POA: Diagnosis present

## 2019-12-11 DIAGNOSIS — Z20822 Contact with and (suspected) exposure to covid-19: Secondary | ICD-10-CM | POA: Diagnosis not present

## 2019-12-11 HISTORY — DX: Anemia, unspecified: D64.9

## 2019-12-11 HISTORY — DX: Basal cell carcinoma of skin, unspecified: C44.91

## 2019-12-11 HISTORY — DX: Personal history of urinary calculi: Z87.442

## 2019-12-11 HISTORY — DX: Mononeuropathy, unspecified: G58.9

## 2019-12-11 HISTORY — DX: Other complications of anesthesia, initial encounter: T88.59XA

## 2019-12-11 HISTORY — DX: Unspecified osteoarthritis, unspecified site: M19.90

## 2019-12-11 HISTORY — DX: Fracture of neck, unspecified, initial encounter: S12.9XXA

## 2019-12-11 LAB — CBC
HCT: 42 % (ref 39.0–52.0)
Hemoglobin: 13.5 g/dL (ref 13.0–17.0)
MCH: 30 pg (ref 26.0–34.0)
MCHC: 32.1 g/dL (ref 30.0–36.0)
MCV: 93.3 fL (ref 80.0–100.0)
Platelets: 243 10*3/uL (ref 150–400)
RBC: 4.5 MIL/uL (ref 4.22–5.81)
RDW: 13.2 % (ref 11.5–15.5)
WBC: 6.4 10*3/uL (ref 4.0–10.5)
nRBC: 0 % (ref 0.0–0.2)

## 2019-12-11 LAB — BASIC METABOLIC PANEL
Anion gap: 8 (ref 5–15)
BUN: 16 mg/dL (ref 8–23)
CO2: 30 mmol/L (ref 22–32)
Calcium: 9.2 mg/dL (ref 8.9–10.3)
Chloride: 103 mmol/L (ref 98–111)
Creatinine, Ser: 1.01 mg/dL (ref 0.61–1.24)
GFR calc Af Amer: >60 mL/min (ref >60)
GFR calc non Af Amer: >60 mL/min (ref >60)
Glucose, Bld: 103 mg/dL — ABNORMAL HIGH (ref 70–99)
Potassium: 5.1 mmol/L (ref 3.5–5.1)
Sodium: 141 mmol/L (ref 135–145)

## 2019-12-11 LAB — SARS CORONAVIRUS 2 (TAT 6-24 HRS): SARS Coronavirus 2: NEGATIVE

## 2019-12-11 LAB — SURGICAL PCR SCREEN
MRSA, PCR: NEGATIVE
Staphylococcus aureus: NEGATIVE

## 2019-12-11 NOTE — Progress Notes (Signed)
PCP - Dr. Macario Golds and Dr. Lynnette Caffey at [facility] Cardiologist - N/A  Chest x-ray - greater than 1 year EKG - 12/11/19 in epic Stress Test - N/A ECHO - N/A Cardiac Cath - N/A  Sleep Study - N/A CPAP - N/A  Fasting Blood Sugar - N/A Checks Blood Sugar _N/A____ times a day  Blood Thinner Instructions: N/A Aspirin Instructions:N/A  Last Dose:N/A  Anesthesia review: N/A  Patient denies shortness of breath, fever, cough and chest pain at PAT appointment   Patient verbalized understanding of instructions that were given to them at the PAT appointment. Patient was also instructed that they will need to review over the PAT instructions again at home before surgery.
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
## 2019-12-15 ENCOUNTER — Observation Stay (HOSPITAL_COMMUNITY): Payer: No Typology Code available for payment source

## 2019-12-15 ENCOUNTER — Ambulatory Visit (HOSPITAL_COMMUNITY): Payer: No Typology Code available for payment source

## 2019-12-15 ENCOUNTER — Ambulatory Visit (HOSPITAL_COMMUNITY): Payer: No Typology Code available for payment source | Admitting: Anesthesiology

## 2019-12-15 ENCOUNTER — Encounter (HOSPITAL_COMMUNITY): Payer: Self-pay | Admitting: Orthopedic Surgery

## 2019-12-15 ENCOUNTER — Encounter (HOSPITAL_COMMUNITY)
Admission: RE | Disposition: A | Discharge status: Home / Self Care | Payer: Self-pay | Source: Home / Self Care | Attending: Orthopedic Surgery

## 2019-12-15 ENCOUNTER — Observation Stay (HOSPITAL_COMMUNITY)
Admission: RE | Admit: 2019-12-15 | Discharge: 2019-12-17 | Disposition: A | Discharge status: Home / Self Care | Payer: No Typology Code available for payment source | Attending: Orthopedic Surgery | Admitting: Orthopedic Surgery

## 2019-12-15 ENCOUNTER — Other Ambulatory Visit: Payer: Self-pay

## 2019-12-15 DIAGNOSIS — Z79899 Other long term (current) drug therapy: Secondary | ICD-10-CM | POA: Insufficient documentation

## 2019-12-15 DIAGNOSIS — Z8546 Personal history of malignant neoplasm of prostate: Secondary | ICD-10-CM | POA: Insufficient documentation

## 2019-12-15 DIAGNOSIS — Z419 Encounter for procedure for purposes other than remedying health state, unspecified: Secondary | ICD-10-CM

## 2019-12-15 DIAGNOSIS — X58XXXA Exposure to other specified factors, initial encounter: Secondary | ICD-10-CM | POA: Insufficient documentation

## 2019-12-15 DIAGNOSIS — Z21 Asymptomatic human immunodeficiency virus [HIV] infection status: Secondary | ICD-10-CM | POA: Diagnosis not present

## 2019-12-15 DIAGNOSIS — I10 Essential (primary) hypertension: Secondary | ICD-10-CM | POA: Diagnosis not present

## 2019-12-15 DIAGNOSIS — Z791 Long term (current) use of non-steroidal anti-inflammatories (NSAID): Secondary | ICD-10-CM | POA: Diagnosis not present

## 2019-12-15 DIAGNOSIS — Z96649 Presence of unspecified artificial hip joint: Secondary | ICD-10-CM

## 2019-12-15 DIAGNOSIS — M1990 Unspecified osteoarthritis, unspecified site: Secondary | ICD-10-CM | POA: Diagnosis not present

## 2019-12-15 DIAGNOSIS — Z96642 Presence of left artificial hip joint: Secondary | ICD-10-CM | POA: Diagnosis present

## 2019-12-15 DIAGNOSIS — S72002A Fracture of unspecified part of neck of left femur, initial encounter for closed fracture: Secondary | ICD-10-CM | POA: Diagnosis not present

## 2019-12-15 DIAGNOSIS — Z85828 Personal history of other malignant neoplasm of skin: Secondary | ICD-10-CM | POA: Insufficient documentation

## 2019-12-15 HISTORY — PX: TOTAL HIP ARTHROPLASTY: SHX124

## 2019-12-15 LAB — ABO/RH: ABO/RH(D): A NEG

## 2019-12-15 LAB — TYPE AND SCREEN
ABO/RH(D): A NEG
Antibody Screen: NEGATIVE

## 2019-12-15 SURGERY — ARTHROPLASTY, HIP, TOTAL, ANTERIOR APPROACH
Anesthesia: Spinal | Site: Hip | Laterality: Left

## 2019-12-15 MED ORDER — CELECOXIB 200 MG PO CAPS
200.0000 mg | ORAL_CAPSULE | Freq: Two times a day (BID) | ORAL | Status: DC
  Administered 2019-12-15 – 2019-12-17 (×4): 200 mg via ORAL
  Filled 2019-12-15 (×5): qty 1

## 2019-12-15 MED ORDER — HYDROCODONE-ACETAMINOPHEN 5-325 MG PO TABS
1.0000 | ORAL_TABLET | ORAL | Status: DC | PRN
  Administered 2019-12-17 (×3): 2 via ORAL
  Filled 2019-12-15 (×4): qty 2

## 2019-12-15 MED ORDER — HYDROCODONE-ACETAMINOPHEN 7.5-325 MG PO TABS
1.0000 | ORAL_TABLET | ORAL | Status: DC | PRN
  Administered 2019-12-15 – 2019-12-16 (×3): 2 via ORAL
  Administered 2019-12-16: 1 via ORAL
  Filled 2019-12-15: qty 2
  Filled 2019-12-15: qty 1
  Filled 2019-12-15 (×2): qty 2

## 2019-12-15 MED ORDER — TRANEXAMIC ACID-NACL 1000-0.7 MG/100ML-% IV SOLN
1000.0000 mg | Freq: Once | INTRAVENOUS | Status: AC
  Administered 2019-12-15: 1000 mg via INTRAVENOUS
  Filled 2019-12-15: qty 100

## 2019-12-15 MED ORDER — ONDANSETRON HCL 4 MG/2ML IJ SOLN: 4.0000 mg | Freq: Four times a day (QID) | INTRAMUSCULAR | Status: DC | PRN

## 2019-12-15 MED ORDER — BISACODYL 10 MG RE SUPP: 10.0000 mg | Freq: Every day | RECTAL | Status: DC | PRN

## 2019-12-15 MED ORDER — METHOCARBAMOL 500 MG IVPB - SIMPLE MED
INTRAVENOUS | Status: AC
  Filled 2019-12-15: qty 50

## 2019-12-15 MED ORDER — FENTANYL CITRATE (PF) 100 MCG/2ML IJ SOLN
25.0000 ug | INTRAMUSCULAR | Status: DC | PRN
  Administered 2019-12-15 (×2): 50 ug via INTRAVENOUS

## 2019-12-15 MED ORDER — ASPIRIN 81 MG PO CHEW
81.0000 mg | CHEWABLE_TABLET | Freq: Two times a day (BID) | ORAL | Status: DC
  Administered 2019-12-15 – 2019-12-17 (×4): 81 mg via ORAL
  Filled 2019-12-15 (×4): qty 1

## 2019-12-15 MED ORDER — METOCLOPRAMIDE HCL 5 MG PO TABS: 5.0000 mg | ORAL_TABLET | Freq: Three times a day (TID) | ORAL | Status: DC | PRN

## 2019-12-15 MED ORDER — PROPOFOL 500 MG/50ML IV EMUL
INTRAVENOUS | Status: DC | PRN
  Administered 2019-12-15: 50 ug/kg/min via INTRAVENOUS

## 2019-12-15 MED ORDER — ALUM & MAG HYDROXIDE-SIMETH 200-200-20 MG/5ML PO SUSP: 15.0000 mL | ORAL | Status: DC | PRN

## 2019-12-15 MED ORDER — ONDANSETRON HCL 4 MG/2ML IJ SOLN
INTRAMUSCULAR | Status: AC
  Filled 2019-12-15: qty 2

## 2019-12-15 MED ORDER — MENTHOL 3 MG MT LOZG: 1.0000 | LOZENGE | OROMUCOSAL | Status: DC | PRN

## 2019-12-15 MED ORDER — ONDANSETRON HCL 4 MG PO TABS: 4.0000 mg | ORAL_TABLET | Freq: Four times a day (QID) | ORAL | Status: DC | PRN

## 2019-12-15 MED ORDER — ONDANSETRON HCL 4 MG/2ML IJ SOLN
INTRAMUSCULAR | Status: DC | PRN
  Administered 2019-12-15: 4 mg via INTRAVENOUS

## 2019-12-15 MED ORDER — PHENYLEPHRINE HCL-NACL 10-0.9 MG/250ML-% IV SOLN
INTRAVENOUS | Status: DC | PRN
  Administered 2019-12-15: 50 ug/min via INTRAVENOUS

## 2019-12-15 MED ORDER — PROPOFOL 500 MG/50ML IV EMUL
INTRAVENOUS | Status: DC | PRN
  Administered 2019-12-15: 20 mg via INTRAVENOUS

## 2019-12-15 MED ORDER — METHOCARBAMOL 500 MG IVPB - SIMPLE MED
500.0000 mg | Freq: Four times a day (QID) | INTRAVENOUS | Status: DC | PRN
  Administered 2019-12-15: 500 mg via INTRAVENOUS
  Filled 2019-12-15: qty 50

## 2019-12-15 MED ORDER — STERILE WATER FOR IRRIGATION IR SOLN
Status: DC | PRN
  Administered 2019-12-15: 2000 mL

## 2019-12-15 MED ORDER — DOCUSATE SODIUM 100 MG PO CAPS
100.0000 mg | ORAL_CAPSULE | Freq: Two times a day (BID) | ORAL | Status: DC
  Administered 2019-12-15 – 2019-12-17 (×3): 100 mg via ORAL
  Filled 2019-12-15 (×5): qty 1

## 2019-12-15 MED ORDER — ABACAVIR-DOLUTEGRAVIR-LAMIVUD 600-50-300 MG PO TABS
1.0000 | ORAL_TABLET | Freq: Every day | ORAL | Status: DC
  Administered 2019-12-16 – 2019-12-17 (×2): 1 via ORAL
  Filled 2019-12-15 (×4): qty 1

## 2019-12-15 MED ORDER — LACTATED RINGERS IV SOLN: INTRAVENOUS | Status: DC

## 2019-12-15 MED ORDER — POLYETHYLENE GLYCOL 3350 17 G PO PACK
17.0000 g | PACK | Freq: Two times a day (BID) | ORAL | Status: DC
  Administered 2019-12-15 – 2019-12-17 (×3): 17 g via ORAL
  Filled 2019-12-15 (×4): qty 1

## 2019-12-15 MED ORDER — CEFAZOLIN SODIUM-DEXTROSE 2-4 GM/100ML-% IV SOLN
2.0000 g | INTRAVENOUS | Status: AC
  Administered 2019-12-15: 2 g via INTRAVENOUS
  Filled 2019-12-15: qty 100

## 2019-12-15 MED ORDER — 0.9 % SODIUM CHLORIDE (POUR BTL) OPTIME
TOPICAL | Status: DC | PRN
  Administered 2019-12-15: 1000 mL

## 2019-12-15 MED ORDER — DEXAMETHASONE SODIUM PHOSPHATE 10 MG/ML IJ SOLN
10.0000 mg | Freq: Once | INTRAMUSCULAR | Status: AC
  Administered 2019-12-16: 10 mg via INTRAVENOUS
  Filled 2019-12-15: qty 1

## 2019-12-15 MED ORDER — ONDANSETRON HCL 4 MG/2ML IJ SOLN: 4.0000 mg | Freq: Once | INTRAMUSCULAR | Status: DC | PRN

## 2019-12-15 MED ORDER — TRANEXAMIC ACID-NACL 1000-0.7 MG/100ML-% IV SOLN
1000.0000 mg | INTRAVENOUS | Status: AC
  Administered 2019-12-15: 1000 mg via INTRAVENOUS
  Filled 2019-12-15: qty 100

## 2019-12-15 MED ORDER — BUPIVACAINE IN DEXTROSE 0.75-8.25 % IT SOLN
INTRATHECAL | Status: DC | PRN
  Administered 2019-12-15: 1.8 mL via INTRATHECAL

## 2019-12-15 MED ORDER — DEXAMETHASONE SODIUM PHOSPHATE 10 MG/ML IJ SOLN
10.0000 mg | Freq: Once | INTRAMUSCULAR | Status: AC
  Administered 2019-12-15: 10 mg via INTRAVENOUS

## 2019-12-15 MED ORDER — METHOCARBAMOL 500 MG PO TABS
500.0000 mg | ORAL_TABLET | Freq: Four times a day (QID) | ORAL | Status: DC | PRN
  Administered 2019-12-16 (×2): 500 mg via ORAL
  Filled 2019-12-15 (×3): qty 1

## 2019-12-15 MED ORDER — TENOFOVIR ALAFENAMIDE FUMARATE 25 MG PO TABS
1.0000 | ORAL_TABLET | Freq: Every day | ORAL | Status: DC
  Administered 2019-12-16 – 2019-12-17 (×2): 25 mg via ORAL
  Filled 2019-12-15 (×4): qty 1

## 2019-12-15 MED ORDER — PROPOFOL 1000 MG/100ML IV EMUL
INTRAVENOUS | Status: AC
  Filled 2019-12-15: qty 100

## 2019-12-15 MED ORDER — FENTANYL CITRATE (PF) 100 MCG/2ML IJ SOLN
INTRAMUSCULAR | Status: AC
  Filled 2019-12-15: qty 2

## 2019-12-15 MED ORDER — MIDAZOLAM HCL 5 MG/5ML IJ SOLN
INTRAMUSCULAR | Status: DC | PRN
  Administered 2019-12-15: 1 mg via INTRAVENOUS

## 2019-12-15 MED ORDER — DIPHENHYDRAMINE HCL 12.5 MG/5ML PO ELIX: 12.5000 mg | ORAL_SOLUTION | ORAL | Status: DC | PRN

## 2019-12-15 MED ORDER — MAGNESIUM CITRATE PO SOLN: 1.0000 | Freq: Once | ORAL | Status: DC | PRN

## 2019-12-15 MED ORDER — CEFAZOLIN SODIUM-DEXTROSE 2-4 GM/100ML-% IV SOLN
2.0000 g | Freq: Four times a day (QID) | INTRAVENOUS | Status: AC
  Administered 2019-12-15 – 2019-12-16 (×2): 2 g via INTRAVENOUS
  Filled 2019-12-15 (×2): qty 100

## 2019-12-15 MED ORDER — SODIUM CHLORIDE 0.9 % IV SOLN: INTRAVENOUS | Status: DC

## 2019-12-15 MED ORDER — DEXAMETHASONE SODIUM PHOSPHATE 10 MG/ML IJ SOLN
INTRAMUSCULAR | Status: AC
  Filled 2019-12-15: qty 1

## 2019-12-15 MED ORDER — ACETAMINOPHEN 325 MG PO TABS: 325.0000 mg | ORAL_TABLET | Freq: Four times a day (QID) | ORAL | Status: DC | PRN

## 2019-12-15 MED ORDER — HYDROMORPHONE HCL 1 MG/ML IJ SOLN: 0.5000 mg | INTRAMUSCULAR | Status: DC | PRN

## 2019-12-15 MED ORDER — PHENOL 1.4 % MT LIQD: 1.0000 | OROMUCOSAL | Status: DC | PRN

## 2019-12-15 MED ORDER — SULFAMETHOXAZOLE-TRIMETHOPRIM 800-160 MG PO TABS
1.0000 | ORAL_TABLET | ORAL | Status: DC
  Administered 2019-12-16: 1 via ORAL
  Filled 2019-12-15: qty 1

## 2019-12-15 MED ORDER — FERROUS SULFATE 325 (65 FE) MG PO TABS
325.0000 mg | ORAL_TABLET | Freq: Three times a day (TID) | ORAL | Status: DC
  Administered 2019-12-16 – 2019-12-17 (×4): 325 mg via ORAL
  Filled 2019-12-15 (×5): qty 1

## 2019-12-15 MED ORDER — METOCLOPRAMIDE HCL 5 MG/ML IJ SOLN: 5.0000 mg | Freq: Three times a day (TID) | INTRAMUSCULAR | Status: DC | PRN

## 2019-12-15 MED ORDER — MIDAZOLAM HCL 2 MG/2ML IJ SOLN
INTRAMUSCULAR | Status: AC
  Filled 2019-12-15: qty 2

## 2019-12-15 MED ORDER — AMLODIPINE BESYLATE 10 MG PO TABS
10.0000 mg | ORAL_TABLET | Freq: Every day | ORAL | Status: DC
  Administered 2019-12-16 – 2019-12-17 (×2): 10 mg via ORAL
  Filled 2019-12-15 (×4): qty 1

## 2019-12-15 SURGICAL SUPPLY — 48 items
ADH SKN CLS APL DERMABOND .7 (GAUZE/BANDAGES/DRESSINGS) ×1
BAG DECANTER FOR FLEXI CONT (MISCELLANEOUS) IMPLANT
BAG SPEC THK2 15X12 ZIP CLS (MISCELLANEOUS) ×1
BAG ZIPLOCK 12X15 (MISCELLANEOUS) ×2 IMPLANT
BLADE SAG 18X100X1.27 (BLADE) ×3 IMPLANT
BLADE SURG SZ10 CARB STEEL (BLADE) ×6 IMPLANT
COVER PERINEAL POST (MISCELLANEOUS) ×3 IMPLANT
COVER SURGICAL LIGHT HANDLE (MISCELLANEOUS) ×3 IMPLANT
COVER WAND RF STERILE (DRAPES) IMPLANT
CUP ACETBLR 54 OD PINNACLE (Hips) ×2 IMPLANT
DERMABOND ADVANCED (GAUZE/BANDAGES/DRESSINGS) ×2
DERMABOND ADVANCED .7 DNX12 (GAUZE/BANDAGES/DRESSINGS) ×1 IMPLANT
DRAPE STERI IOBAN 125X83 (DRAPES) ×3 IMPLANT
DRAPE U-SHAPE 47X51 STRL (DRAPES) ×6 IMPLANT
DRESSING AQUACEL AG SP 3.5X10 (GAUZE/BANDAGES/DRESSINGS) ×1 IMPLANT
DRSG AQUACEL AG ADV 3.5X10 (GAUZE/BANDAGES/DRESSINGS) ×2 IMPLANT
DRSG AQUACEL AG SP 3.5X10 (GAUZE/BANDAGES/DRESSINGS) ×3
DURAPREP 26ML APPLICATOR (WOUND CARE) ×3 IMPLANT
ELECT REM PT RETURN 15FT ADLT (MISCELLANEOUS) ×3 IMPLANT
ELIMINATOR HOLE APEX DEPUY (Hips) ×2 IMPLANT
GLOVE BIO SURGEON STRL SZ 6 (GLOVE) ×6 IMPLANT
GLOVE BIOGEL PI IND STRL 6.5 (GLOVE) ×1 IMPLANT
GLOVE BIOGEL PI IND STRL 7.5 (GLOVE) ×1 IMPLANT
GLOVE BIOGEL PI IND STRL 8.5 (GLOVE) ×1 IMPLANT
GLOVE BIOGEL PI INDICATOR 6.5 (GLOVE) ×2
GLOVE BIOGEL PI INDICATOR 7.5 (GLOVE) ×2
GLOVE BIOGEL PI INDICATOR 8.5 (GLOVE) ×2
GLOVE ECLIPSE 8.0 STRL XLNG CF (GLOVE) ×6 IMPLANT
GLOVE ORTHO TXT STRL SZ7.5 (GLOVE) ×6 IMPLANT
GOWN STRL REUS W/TWL LRG LVL3 (GOWN DISPOSABLE) ×6 IMPLANT
GOWN STRL REUS W/TWL XL LVL3 (GOWN DISPOSABLE) ×3 IMPLANT
HEAD CERAMIC 36 PLUS 8.5 12 14 (Hips) ×2 IMPLANT
HOLDER FOLEY CATH W/STRAP (MISCELLANEOUS) ×3 IMPLANT
KIT TURNOVER KIT A (KITS) IMPLANT
LINER NEUTRAL 54X36MM PLUS 4 (Hips) ×2 IMPLANT
PACK ANTERIOR HIP CUSTOM (KITS) ×3 IMPLANT
PENCIL SMOKE EVACUATOR (MISCELLANEOUS) ×2 IMPLANT
SCREW PINN CAN 6.5X20 (Screw) ×2 IMPLANT
STEM FEM ACTIS HIGH SZ10 (Stem) ×2 IMPLANT
SUT MNCRL AB 4-0 PS2 18 (SUTURE) ×3 IMPLANT
SUT STRATAFIX 0 PDS 27 VIOLET (SUTURE) ×3
SUT VIC AB 1 CT1 36 (SUTURE) ×9 IMPLANT
SUT VIC AB 2-0 CT1 27 (SUTURE) ×6
SUT VIC AB 2-0 CT1 TAPERPNT 27 (SUTURE) ×2 IMPLANT
SUTURE STRATFX 0 PDS 27 VIOLET (SUTURE) ×1 IMPLANT
TRAY FOLEY MTR SLVR 16FR STAT (SET/KITS/TRAYS/PACK) ×2 IMPLANT
WATER STERILE IRR 1000ML POUR (IV SOLUTION) ×3 IMPLANT
YANKAUER SUCT BULB TIP 10FT TU (MISCELLANEOUS) ×2 IMPLANT

## 2019-12-15 NOTE — Plan of Care (Signed)
Plan of care 

## 2019-12-15 NOTE — Op Note (Signed)
NAME:  Jimmy Conner                ACCOUNT NO.: [id number]      MEDICAL RECORD NO.: [id number]      FACILITY:  [facility]      PHYSICIAN:  Mauri Pole  DATE OF BIRTH:  06/06/1946     DATE OF PROCEDURE:  12/15/2019                                 OPERATIVE REPORT         PREOPERATIVE DIAGNOSIS: Left femoral neck fracturee.      POSTOPERATIVE DIAGNOSIS:  Left femoral neck fracture.      PROCEDURE:  Left total hip replacement through an anterior approach   utilizing DePuy THR system, component size 54 mm pinnacle cup, a size 36+4 neutral   Altrex liner, a size 10 Hi Actis stem with a 36+8.55 delta ceramic   ball.      SURGEON:  Pietro Cassis. Alvan Dame, M.D.      ASSISTANT:  Danae Orleans, PA-C     ANESTHESIA:  Spinal.      SPECIMENS:  None.      COMPLICATIONS:  None.      BLOOD LOSS:  500 cc     DRAINS:  None.      INDICATION OF THE PROCEDURE:  Jimmy Conner is a 74 y.o. male who had   presented to office for evaluation of left hip pain.  Radiographs from the outside and confirmed in our office revealed a displaced left femoral neck fracture. Due to this finding and the affect on his quality of life and function I reviewed with him that total hip replacement would be the treatment of choice for his fracture.  Consent was obtained for   benefit of pain relief.  Specific risks of infection, DVT, component   failure, dislocation, neurovascular injury, and need for revision surgery were reviewed in the office as well discussion of the anterior versus posterior approach were reviewed.     PROCEDURE IN DETAIL:  The patient was brought to operative theater.   Once adequate anesthesia, preoperative antibiotics, 2 gm of Ancef, 1 gm of Tranexamic Acid, and 10 mg of Decadron were administered, the patient was positioned supine on the [employer] table.  Once the patient was safely positioned with adequate padding of boney prominences we predraped out the hip, and used  fluoroscopy to confirm orientation of the pelvis.      The left hip was then prepped and draped from proximal iliac crest to   mid thigh with a shower curtain technique.      Time-out was performed identifying the patient, planned procedure, and the appropriate extremity.     An incision was then made 2 cm lateral to the   anterior superior iliac spine extending over the orientation of the   tensor fascia lata muscle and sharp dissection was carried down to the   fascia of the muscle.      The fascia was then incised.  The muscle belly was identified and swept   laterally and retractor placed along the superior neck.  Following   cauterization of the circumflex vessels and removing some pericapsular   fat, a second cobra retractor was placed on the inferior neck.  A T-capsulotomy was made along the line of the   superior neck to the trochanteric  fossa, then extended proximally and   distally.  Tag sutures were placed and the retractors were then placed   intracapsular.  We then identified the trochanteric fossa and   orientation of my neck cut and then made a neck osteotomy with the femur on traction.  The femoral   head and fractured femoral neck segment were removed without difficulty or complication.  Traction was let   off and retractors were placed posterior and anterior around the   acetabulum.      The labrum and foveal tissue were debrided.  I began reaming with a 45 mm   reamer and reamed up to 53 mm reamer with good bony bed preparation and a 54 mm  cup was chosen.  The final 54 mm Pinnacle cup was then impacted under fluoroscopy to confirm the depth of penetration and orientation with respect to   Abduction and forward flexion.  A screw was placed into the ilium followed by the hole eliminator.  The final   36+4 neutral Altrex liner was impacted with good visualized rim fit.  The cup was positioned anatomically within the acetabular portion of the pelvis.      At this point,  the femur was rolled to 100 degrees.  Further capsule was   released off the inferior aspect of the femoral neck.  I then   released the superior capsule proximally.  With the leg in a neutral position the hook was placed laterally   along the femur under the vastus lateralis origin and elevated manually and then held in position using the hook attachment on the bed.  The leg was then extended and adducted with the leg rolled to 100   degrees of external rotation.  Retractors were placed along the medial calcar and posteriorly over the greater trochanter.  Once the proximal femur was fully   exposed, I used a box osteotome to set orientation.  I then began   broaching with the starting chili pepper broach and passed this by hand and then broached up to 10.  With the 10 broach in place I chose a high offset neck and did several trial reductions.  The offset was appropriate, leg lengths   appeared to be equal best matched with the +8.5 head ball trial confirmed radiographically.   Given these findings, I went ahead and dislocated the hip, repositioned all   retractors and positioned the right hip in the extended and abducted position.  The final 10 Hi Actis stem was   chosen and it was impacted down to the level of neck cut.  Based on this   and the trial reductions, a final 36+8.5 delta ceramic ball was chosen and   impacted onto a clean and dry trunnion, and the hip was reduced.  The   hip had been irrigated throughout the case again at this point.  I did   reapproximate the superior capsular leaflet to the anterior leaflet   using #1 Vicryl.  The fascia of the   tensor fascia lata muscle was then reapproximated using #1 Vicryl and #0 Stratafix sutures.  The   remaining wound was closed with 2-0 Vicryl and running 4-0 Monocryl.   The hip was cleaned, dried, and dressed sterilely using Dermabond and   Aquacel dressing.  The patient was then brought   to recovery room in stable condition  tolerating the procedure well.    Danae Orleans, PA-C was present for the entirety of the case involved from  preoperative positioning, perioperative retractor management, general   facilitation of the case, as well as primary wound closure as assistant.            Pietro Cassis Alvan Dame, M.D.        12/15/2019 12:05 PM

## 2019-12-15 NOTE — Anesthesia Procedure Notes (Signed)
Date/Time: 12/15/2019 1:01 PM Performed by: Glory Buff, CRNA Oxygen Delivery Method: Simple face mask

## 2019-12-15 NOTE — Discharge Instructions (Signed)

## 2019-12-15 NOTE — Anesthesia Postprocedure Evaluation (Signed)
Anesthesia Post Note  Patient: Jimmy Conner  Procedure(s) Performed: TOTAL HIP ARTHROPLASTY ANTERIOR APPROACH (Left Hip)     Patient location during evaluation: PACU Anesthesia Type: Spinal Level of consciousness: awake and alert Pain management: pain level controlled Vital Signs Assessment: post-procedure vital signs reviewed and stable Respiratory status: spontaneous breathing and respiratory function stable Cardiovascular status: blood pressure returned to baseline and stable Postop Assessment: spinal receding Anesthetic complications: no    Last Vitals:  Vitals:   12/15/19 1630 12/15/19 1645  BP: (!) 153/116 (!) 158/100  Pulse: 94 81  Resp: 17 16  Temp:    SpO2: 100% 100%    Last Pain:  Vitals:   12/15/19 1630  TempSrc:   PainSc: 0-No pain                 Tiajuana Amass

## 2019-12-15 NOTE — Anesthesia Preprocedure Evaluation (Addendum)
Anesthesia Evaluation  Patient identified by MRN, date of birth, ID band Patient awake    Reviewed: Allergy & Precautions, NPO status , Patient's Chart, lab work & pertinent test results  Airway Mallampati: II  TM Distance: >3 FB Neck ROM: Full    Dental   Pulmonary neg pulmonary ROS,    breath sounds clear to auscultation       Cardiovascular hypertension, Pt. on medications  Rhythm:Regular Rate:Normal     Neuro/Psych negative neurological ROS     GI/Hepatic negative GI ROS, (+) Hepatitis -  Endo/Other  negative endocrine ROS  Renal/GU Renal disease     Musculoskeletal  (+) Arthritis ,   Abdominal   Peds  Hematology  (+) HIV,   Anesthesia Other Findings   Reproductive/Obstetrics                            Anesthesia Physical Anesthesia Plan  ASA: II  Anesthesia Plan: Spinal   Post-op Pain Management:    Induction:   PONV Risk Score and Plan: 1 and Propofol infusion, Ondansetron and Treatment may vary due to age or medical condition  Airway Management Planned: Natural Airway and Simple Face Mask  Additional Equipment: None  Intra-op Plan:   Post-operative Plan:   Informed Consent: I have reviewed the patients History and Physical, chart, labs and discussed the procedure including the risks, benefits and alternatives for the proposed anesthesia with the patient or authorized representative who has indicated his/her understanding and acceptance.     Dental advisory given  Plan Discussed with: CRNA  Anesthesia Plan Comments:         Anesthesia Quick Evaluation

## 2019-12-15 NOTE — Anesthesia Procedure Notes (Signed)
Spinal  Patient location during procedure: OR Start time: 12/15/2019 1:04 PM End time: 12/15/2019 1:07 PM Staffing Performed: resident/CRNA  Anesthesiologist: Suzette Battiest, MD Resident/CRNA: Glory Buff, CRNA Preanesthetic Checklist Completed: patient identified, IV checked, site marked, risks and benefits discussed, surgical consent, monitors and equipment checked, pre-op evaluation and timeout performed Spinal Block Patient position: sitting Prep: DuraPrep Patient monitoring: heart rate, cardiac monitor, continuous pulse ox and blood pressure Approach: midline Location: L3-4 Injection technique: single-shot Needle Needle type: Pencan  Needle gauge: 24 G Needle length: 9 cm Assessment Sensory level: T6 Additional Notes Kit expiration date checked and verified.  Sterile prep and drape, skin local with 1% lidocaine, stick x 1, - heme, - paraesthesia, + CSF pre and post injection, patient tolerated procedure well.

## 2019-12-15 NOTE — Interval H&P Note (Signed)
History and Physical Interval Note:  12/15/2019 12:00 PM  Jimmy Conner  has presented today for surgery, with the diagnosis of Left femoral neck fracture.  The various methods of treatment have been discussed with the patient and family. After consideration of risks, benefits and other options for treatment, the patient has consented to  Procedure(s) with comments: TOTAL HIP ARTHROPLASTY ANTERIOR APPROACH (Left) - 70 mins as a surgical intervention.  The patient's history has been reviewed, patient examined, no change in status, stable for surgery.  I have reviewed the patient's chart and labs.  Questions were answered to the patient's satisfaction.     Mauri Pole

## 2019-12-15 NOTE — Transfer of Care (Signed)
Immediate Anesthesia Transfer of Care Note  Patient: EDWORD CU  Procedure(s) Performed: TOTAL HIP ARTHROPLASTY ANTERIOR APPROACH (Left Hip)  Patient Location: PACU  Anesthesia Type:MAC and Spinal  Level of Consciousness: drowsy, patient cooperative and responds to stimulation  Airway & Oxygen Therapy: Patient Spontanous Breathing and Patient connected to face mask oxygen  Post-op Assessment: Report given to RN and Post -op Vital signs reviewed and stable  Post vital signs: Reviewed and stable  Last Vitals:  Vitals Value Taken Time  BP 122/70 12/15/19 1503  Temp    Pulse    Resp 16 12/15/19 1507  SpO2    Vitals shown include unvalidated device data.  Last Pain:  Vitals:   12/15/19 1230  TempSrc:   PainSc: 0-No pain         Complications: No apparent anesthesia complications

## 2019-12-16 ENCOUNTER — Encounter: Payer: Self-pay | Admitting: *Deleted

## 2019-12-16 DIAGNOSIS — S72002A Fracture of unspecified part of neck of left femur, initial encounter for closed fracture: Secondary | ICD-10-CM | POA: Diagnosis not present

## 2019-12-16 LAB — BASIC METABOLIC PANEL
Anion gap: 8 (ref 5–15)
BUN: 15 mg/dL (ref 8–23)
CO2: 27 mmol/L (ref 22–32)
Calcium: 8.7 mg/dL — ABNORMAL LOW (ref 8.9–10.3)
Chloride: 105 mmol/L (ref 98–111)
Creatinine, Ser: 0.93 mg/dL (ref 0.61–1.24)
GFR calc Af Amer: >60 mL/min (ref >60)
GFR calc non Af Amer: >60 mL/min (ref >60)
Glucose, Bld: 150 mg/dL — ABNORMAL HIGH (ref 70–99)
Potassium: 3.8 mmol/L (ref 3.5–5.1)
Sodium: 140 mmol/L (ref 135–145)

## 2019-12-16 LAB — CBC
HCT: 34.2 % — ABNORMAL LOW (ref 39.0–52.0)
Hemoglobin: 11.3 g/dL — ABNORMAL LOW (ref 13.0–17.0)
MCH: 30.5 pg (ref 26.0–34.0)
MCHC: 33 g/dL (ref 30.0–36.0)
MCV: 92.2 fL (ref 80.0–100.0)
Platelets: 209 10*3/uL (ref 150–400)
RBC: 3.71 MIL/uL — ABNORMAL LOW (ref 4.22–5.81)
RDW: 13.3 % (ref 11.5–15.5)
WBC: 10.8 10*3/uL — ABNORMAL HIGH (ref 4.0–10.5)
nRBC: 0 % (ref 0.0–0.2)

## 2019-12-16 MED ORDER — ASPIRIN 81 MG PO CHEW: 81.0000 mg | CHEWABLE_TABLET | Freq: Two times a day (BID) | ORAL | 0 refills | Status: AC

## 2019-12-16 MED ORDER — POLYETHYLENE GLYCOL 3350 17 G PO PACK: 17.0000 g | PACK | Freq: Two times a day (BID) | ORAL | 0 refills | Status: DC

## 2019-12-16 MED ORDER — DOCUSATE SODIUM 100 MG PO CAPS: 100.0000 mg | ORAL_CAPSULE | Freq: Two times a day (BID) | ORAL | 0 refills | Status: DC

## 2019-12-16 MED ORDER — FERROUS SULFATE 325 (65 FE) MG PO TABS: 325.0000 mg | ORAL_TABLET | Freq: Three times a day (TID) | ORAL | 0 refills | Status: DC

## 2019-12-16 MED ORDER — HYDROCODONE-ACETAMINOPHEN 7.5-325 MG PO TABS: 1.0000 | ORAL_TABLET | ORAL | 0 refills | Status: DC | PRN

## 2019-12-16 MED ORDER — METHOCARBAMOL 500 MG PO TABS: 500.0000 mg | ORAL_TABLET | Freq: Four times a day (QID) | ORAL | 0 refills | Status: DC | PRN

## 2019-12-16 NOTE — Progress Notes (Signed)
     Subjective: 1 Day Post-Op Procedure(s) (LRB): TOTAL HIP ARTHROPLASTY ANTERIOR APPROACH (Left)   Patient reports pain as moderate, pain controlled medication.  No reported events throughout the night.  We discussed the procedure, findings and expectations moving forward.  We did discuss his partial weightbearing status and he states he understands.  Patient is ready be discharged home, if he does well with therapy.  Patient is to call with any questions or concerns.  Patient follow-up in the clinic in 2 weeks.     Objective:   VITALS:   Vitals:   12/16/19 0242 12/16/19 0635  BP: (!) 167/99 (!) 163/99  Pulse: 93 90  Resp: 18 18  Temp: 98.4 F (36.9 C) 98.6 F (37 C)  SpO2: 100% 100%    Dorsiflexion/Plantar flexion intact Incision: dressing C/D/I No cellulitis present Compartment soft  LABS Recent Labs    12/16/19 0326  HGB 11.3*  HCT 34.2*  WBC 10.8*  PLT 209    Recent Labs    12/16/19 0326  NA 140  K 3.8  BUN 15  CREATININE 0.93  GLUCOSE 150*     Assessment/Plan: 1 Day Post-Op Procedure(s) (LRB): TOTAL HIP ARTHROPLASTY ANTERIOR APPROACH (Left) Foley cath d/c'ed Advance diet Up with therapy D/C IV fluids Discharge home Follow up in 2 weeks at [facility] Follow up with OLIN,Santhosh Gulino D in 2 weeks.  Contact information:  [facility] [address] [place] [id number]    Overweight (BMI 25-29.9) Estimated body mass index is 26.62 kg/m as calculated from the following:   Height as of this encounter: 5\' 8"  (1.727 m).   Weight as of this encounter: 79.4 kg. Patient also counseled that weight may inhibit the healing process Patient counseled that losing weight will help with future health issues        Danae Orleans PA-C  [facility]  [place] [address], [place], [place] [address] Phone: [phone] [email or web address] Facebook  [employer]

## 2019-12-16 NOTE — Evaluation (Signed)
Physical Therapy Evaluation Patient Details Name: Jimmy Conner MRN: [id number] DOB: 1946-07-10 Today's Date: 12/16/2019   History of Present Illness  74 yo male with Left hip femoral neck fracture / pain, s/p L DA THA per Dr Alvan Dame on 12/15/19. PMH: HIV, HTN  Clinical Impression  Pt is s/p THA 2* fx resulting in the deficits listed below (see PT Problem List).  Pt will benefit from skilled PT to increase their independence and safety with mobility to allow discharge to the venue listed below.  Pt amb short distance this am, very slow and unsteady. would benefit from HHPT, needs RW and 3in1. Will continue to follow      Follow Up Recommendations Home health PT;Supervision for mobility/OOB    Equipment Recommendations  Rolling walker with 5" wheels;3in1 (PT)    Recommendations for Other Services       Precautions / Restrictions Precautions Precautions: Fall Restrictions LLE Weight Bearing: Partial weight bearing LLE Partial Weight Bearing Percentage or Pounds: 50%      Mobility  Bed Mobility Overal bed mobility: Needs Assistance Bed Mobility: Supine to Sit     Supine to sit: Min assist     General bed mobility comments: assist with LLE, incr time  Transfers Overall transfer level: Needs assistance Equipment used: Rolling walker (2 wheeled) Transfers: Sit to/from Stand Sit to Stand: Min assist;Mod assist         General transfer comment: assist to rise and transition to RW, incr time, cues for hand placement and safety  Ambulation/Gait Ambulation/Gait assistance: Min assist Gait Distance (Feet): 20 Feet Assistive device: Rolling walker (2 wheeled) Gait Pattern/deviations: Step-to pattern Gait velocity: decr   General Gait Details: cues for sequence, RW position. very slow  gait, no overt LOB however assist to steady with wt shift, cues for [facility]  Stairs            Wheelchair Mobility    Modified Rankin (Stroke Patients Only)       Balance                                              Pertinent Vitals/Pain Pain Assessment: 0-10 Pain Score: 6  Pain Location: L hip Pain Descriptors / Indicators: Discomfort;Sore Pain Intervention(s): Limited activity within patient's tolerance;Monitored during session;Premedicated before session;Repositioned;Ice applied    Home Living Family/patient expects to be discharged to:: Private residence Living Arrangements: Spouse/significant other Available Help at Discharge: Available PRN/intermittently Type of Home: Apartment Home Access: Level entry     Home Layout: One level Home Equipment: Cane - single point      Prior Function Level of Independence: Independent               Hand Dominance        Extremity/Trunk Assessment   Upper Extremity Assessment Upper Extremity Assessment: Overall WFL for tasks assessed    Lower Extremity Assessment Lower Extremity Assessment: LLE deficits/detail LLE Deficits / Details: ankle WFL, knee and hip grossly 2 to 2+/5, limited by post op pain and weakness       Communication   Communication: No difficulties  Cognition Arousal/Alertness: Awake/alert Behavior During Therapy: WFL for tasks assessed/performed Overall Cognitive Status: Impaired/Different from baseline Area of Impairment: Following commands;Problem solving                       Following  Commands: Follows multi-step commands inconsistently;Follows one step commands with increased time     Problem Solving: Slow processing;Requires verbal cues        General Comments      Exercises     Assessment/Plan    PT Assessment Patient needs continued PT services  PT Problem List Decreased strength;Decreased activity tolerance;Decreased range of motion;Decreased balance;Decreased knowledge of use of DME;Decreased mobility;Decreased safety awareness;Pain       PT Treatment Interventions DME instruction;Gait training;Functional mobility  training;Therapeutic activities;Patient/family education;Therapeutic exercise    PT Goals (Current goals can be found in the Care Plan section)  Acute Rehab PT Goals Patient Stated Goal: go back home soon PT Goal Formulation: With patient Time For Goal Achievement: 12/23/19 Potential to Achieve Goals: Good    Frequency Min 6X/week   Barriers to discharge        Co-evaluation               AM-PAC PT "6 Clicks" Mobility  Outcome Measure Help needed turning from your back to your side while in a flat bed without using bedrails?: A Little Help needed moving from lying on your back to sitting on the side of a flat bed without using bedrails?: A Little Help needed moving to and from a bed to a chair (including a wheelchair)?: A Little Help needed standing up from a chair using your arms (e.g., wheelchair or bedside chair)?: A Lot Help needed to walk in hospital room?: A Little Help needed climbing 3-5 steps with a railing? : A Lot 6 Click Score: 16    End of Session Equipment Utilized During Treatment: Gait belt Activity Tolerance: Patient tolerated treatment well Patient left: in chair;with call bell/phone within reach;with chair alarm set Nurse Communication: Mobility status PT Visit Diagnosis: Difficulty in walking, not elsewhere classified (R26.2);History of falling (Z91.81)    Time: [id number] PT Time Calculation (min) (ACUTE ONLY): 19 min   Charges:   PT Evaluation $PT Eval Low Complexity: 1 Low          Suzane Vanderweide, PT   Acute Rehab Dept [facility]): [id number]   12/16/2019   [facility] 12/16/2019, 12:50 PM

## 2019-12-16 NOTE — Progress Notes (Signed)
12/16/19 1400  PT Visit Information  Last PT Received On 12/16/19  Pt progressing toward PT goals. Continues to require cues for safety, PWB during gait.  Pt will benefit from another day of therapy to incr independence and safety prior to return home.  Assistance Needed +1  History of Present Illness 74 yo male with Left hip femoral neck fracture / pain, s/p L DA THA per Dr Alvan Dame on 12/15/19. PMH: HIV, HTN  Subjective Data  Patient Stated Goal go back home soon  Precautions  Precautions Fall  Restrictions  LLE Weight Bearing PWB  LLE Partial Weight Bearing Percentage or Pounds 50%  Pain Assessment  Pain Score 3  Pain Location L hip  Pain Descriptors / Indicators Discomfort;Sore  Pain Intervention(s) Limited activity within patient's tolerance;Monitored during session;Premedicated before session;Repositioned  Cognition  Arousal/Alertness Awake/alert  Behavior During Therapy WFL for tasks assessed/performed  Overall Cognitive Status No family/caregiver present to determine baseline cognitive functioning  Following Commands Follows one step commands consistently;Follows multi-step commands with increased time  Problem Solving Difficulty sequencing;Requires verbal cues  General Comments improved from am session. pt is tangential at times, talkative regarding past situations, occasional redirection to current task.  Bed Mobility  Overal bed mobility Needs Assistance  Bed Mobility Sit to Supine  Sit to supine Min guard  General bed mobility comments incr time, min/guard for safety   Transfers  Overall transfer level Needs assistance  Equipment used Rolling walker (2 wheeled)  Transfers Sit to/from Stand  Sit to Stand Min guard  General transfer comment cues for hand placement   Ambulation/Gait  Ambulation/Gait assistance Min guard  Gait Distance (Feet) 45 Feet  Assistive device Rolling walker (2 wheeled)  Gait Pattern/deviations Step-to pattern  General Gait Details cues for  sequence, PWB, RW position  Gait velocity decr  Total Joint Exercises  Ankle Circles/Pumps AROM;Both;10 reps  Quad Sets AROM;Both;10 reps  Heel Slides AAROM;Left;10 reps  Hip ABduction/ADduction AAROM;Left;10 reps  PT - End of Session  Equipment Utilized During Treatment Gait belt  Activity Tolerance Patient tolerated treatment well  Patient left with call bell/phone within reach;in bed;with bed alarm set  Nurse Communication Mobility status   PT - Assessment/Plan  PT Plan Current plan remains appropriate  PT Visit Diagnosis Difficulty in walking, not elsewhere classified (R26.2);History of falling (Z91.81)  PT Frequency (ACUTE ONLY) Min 6X/week  Follow Up Recommendations Home health PT;Supervision for mobility/OOB  PT equipment Rolling walker with 5" wheels;3in1 (PT)  AM-PAC PT "6 Clicks" Mobility Outcome Measure (Version 2)  Help needed turning from your back to your side while in a flat bed without using bedrails? 3  Help needed moving from lying on your back to sitting on the side of a flat bed without using bedrails? 3  Help needed moving to and from a bed to a chair (including a wheelchair)? 3  Help needed standing up from a chair using your arms (e.g., wheelchair or bedside chair)? 3  Help needed to walk in hospital room? 3  Help needed climbing 3-5 steps with a railing?  2  6 Click Score 17  Consider Recommendation of Discharge To: Home with [facility]  PT Goal Progression  Progress towards PT goals Progressing toward goals  Acute Rehab PT Goals  PT Goal Formulation With patient  Time For Goal Achievement 12/23/19  Potential to Achieve Goals Good  PT Time Calculation  PT Start Time (ACUTE ONLY) 1440  PT Stop Time (ACUTE ONLY) 1455  PT Time Calculation (  min) (ACUTE ONLY) 15 min  PT General Charges  $$ ACUTE PT VISIT 1 Visit  PT Treatments  $Gait Training 8-22 mins

## 2019-12-16 NOTE — TOC Progression Note (Signed)
Transition of Care [facility]) - Progression Note    Patient Details  Name: Jimmy Conner MRN: [id number] Date of Birth: 09/03/1946  Transition of Care [facility]) CM/SW [place], LCSW Phone Number: 12/16/2019, 1:36 PM  Clinical Narrative:  Therapy Plan: HEP    CSW met with the patient at bedside to discuss DME needs. Patient report his significant other Jimmy Conner has been trying to arrange [facility] the patient a RW and 3 IN 1 but has not been successful. She reports, " I was given the run around and no one could help me. I am going to [facility] and purchase his walker and bedside commode. I have already purchased a shower chair."  She plans to purchase DME today before the patient discharges.       Barriers to Discharge: Barriers Resolved  Expected Discharge Plan and Services           Expected Discharge Date: 12/16/19                                     Social Determinants of Health (SDOH) Interventions    Readmission Risk Interventions No flowsheet data found.

## 2019-12-16 NOTE — Progress Notes (Signed)
After 2nd PT session, it is their opinion that another night is needed with more PT tomorrow.  See PT notes. Contacted [place], [place], who said it would be ok to extend his discharge until tomorrow.

## 2019-12-16 NOTE — Plan of Care (Signed)

## 2019-12-17 DIAGNOSIS — S72002A Fracture of unspecified part of neck of left femur, initial encounter for closed fracture: Secondary | ICD-10-CM | POA: Diagnosis not present

## 2019-12-17 LAB — CBC
HCT: 30.4 % — ABNORMAL LOW (ref 39.0–52.0)
Hemoglobin: 9.9 g/dL — ABNORMAL LOW (ref 13.0–17.0)
MCH: 30.1 pg (ref 26.0–34.0)
MCHC: 32.6 g/dL (ref 30.0–36.0)
MCV: 92.4 fL (ref 80.0–100.0)
Platelets: 183 10*3/uL (ref 150–400)
RBC: 3.29 MIL/uL — ABNORMAL LOW (ref 4.22–5.81)
RDW: 13.5 % (ref 11.5–15.5)
WBC: 10.2 10*3/uL (ref 4.0–10.5)
nRBC: 0 % (ref 0.0–0.2)

## 2019-12-17 LAB — BASIC METABOLIC PANEL
Anion gap: 7 (ref 5–15)
BUN: 30 mg/dL — ABNORMAL HIGH (ref 8–23)
CO2: 28 mmol/L (ref 22–32)
Calcium: 8.7 mg/dL — ABNORMAL LOW (ref 8.9–10.3)
Chloride: 105 mmol/L (ref 98–111)
Creatinine, Ser: 1.06 mg/dL (ref 0.61–1.24)
GFR calc Af Amer: >60 mL/min (ref >60)
GFR calc non Af Amer: >60 mL/min (ref >60)
Glucose, Bld: 140 mg/dL — ABNORMAL HIGH (ref 70–99)
Potassium: 3.9 mmol/L (ref 3.5–5.1)
Sodium: 140 mmol/L (ref 135–145)
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
## 2019-12-17 NOTE — Progress Notes (Signed)
Physical Therapy Treatment Patient Details Name: Jimmy Conner MRN: [id number] DOB: 08-21-1946 Today's Date: 12/17/2019    History of Present Illness 74 yo male with Left hip femoral neck fracture / pain, s/p L DA THA per Dr Alvan Dame on 12/15/19. PMH: HIV, HTN    PT Comments    Pt is progressing well with mobility, he ambulated 100' with RW, reviewed HEP, and initiated stair training. He will benefit from 1 more PT session to review stairs and HEP with his caregiver, then expect he'll be ready to DC home from PT standpoint.    Follow Up Recommendations  Home health PT;Supervision for mobility/OOB     Equipment Recommendations  Rolling walker with 5" wheels;3in1 (PT)    Recommendations for Other Services       Precautions / Restrictions Precautions Precautions: Fall Restrictions Weight Bearing Restrictions: Yes LLE Weight Bearing: Partial weight bearing LLE Partial Weight Bearing Percentage or Pounds: 50%    Mobility  Bed Mobility Overal bed mobility: Needs Assistance Bed Mobility: Supine to Sit     Supine to sit: Modified independent (Device/Increase time);HOB elevated     General bed mobility comments: used rail  Transfers Overall transfer level: Needs assistance Equipment used: Rolling walker (2 wheeled) Transfers: Sit to/from Stand Sit to Stand: Supervision         General transfer comment: cues for hand placement and safety  Ambulation/Gait Ambulation/Gait assistance: Supervision Gait Distance (Feet): 100 Feet Assistive device: Rolling walker (2 wheeled) Gait Pattern/deviations: Step-to pattern;Trunk flexed Gait velocity: decr   General Gait Details: cues for sequence, RW position and posture.  no overt LOB however assist to steady with wt shift, cues for PWB   Stairs Stairs: Yes Stairs assistance: Min assist Stair Management: Backwards;Forwards;With walker Number of Stairs: 1 General stair comments: 1 step x 2 trials; once forwards once backwards  with RW, VCs sequencing, min A to manage RW   Wheelchair Mobility    Modified Rankin (Stroke Patients Only)       Balance Overall balance assessment: Modified Independent                                          Cognition Arousal/Alertness: Awake/alert Behavior During Therapy: WFL for tasks assessed/performed Overall Cognitive Status: No family/caregiver present to determine baseline cognitive functioning                         Following Commands: Follows one step commands consistently;Follows multi-step commands with increased time     Problem Solving: Difficulty sequencing;Requires verbal cues General Comments: improved from am session. pt is tangential at times, talkative regarding past situations, occasional redirection to current task.      Exercises Total Joint Exercises Ankle Circles/Pumps: AROM;Both;10 reps Quad Sets: AROM;Both;5 reps Short Arc Quad: AROM;Left;10 reps;Supine Heel Slides: AAROM;Left;10 reps Hip ABduction/ADduction: AAROM;Left;10 reps Long Arc Quad: AROM;Left;10 reps;Seated    General Comments        Pertinent Vitals/Pain Pain Score: 4  Pain Location: L hip Pain Descriptors / Indicators: Sore Pain Intervention(s): Limited activity within patient's tolerance;Monitored during session;Premedicated before session;Ice applied    Home Living                      Prior Function            PT Goals (current goals can now be found  in the care plan section) Acute Rehab PT Goals Patient Stated Goal: go back home soon PT Goal Formulation: With patient Time For Goal Achievement: 12/23/19 Potential to Achieve Goals: Good Progress towards PT goals: Progressing toward goals    Frequency    Min 6X/week      PT Plan Current plan remains appropriate    Co-evaluation              AM-PAC PT "6 Clicks" Mobility   Outcome Measure  Help needed turning from your back to your side while in a flat bed  without using bedrails?: A Little Help needed moving from lying on your back to sitting on the side of a flat bed without using bedrails?: A Little Help needed moving to and from a bed to a chair (including a wheelchair)?: A Little Help needed standing up from a chair using your arms (e.g., wheelchair or bedside chair)?: A Little Help needed to walk in hospital room?: A Little Help needed climbing 3-5 steps with a railing? : A Little 6 Click Score: 18    End of Session Equipment Utilized During Treatment: Gait belt Activity Tolerance: Patient tolerated treatment well Patient left: with call bell/phone within reach;in chair;with chair alarm set Nurse Communication: Mobility status PT Visit Diagnosis: Difficulty in walking, not elsewhere classified (R26.2);History of falling (Z91.81)     Time: [id number] PT Time Calculation (min) (ACUTE ONLY): 33 min  Charges:  $Gait Training: 8-22 mins $Therapeutic Exercise: 8-22 mins                     Jimmy Conner PT 12/17/2019  Acute Rehabilitation Services Pager [phone] Office [phone]

## 2019-12-17 NOTE — Progress Notes (Signed)
RN reviewed discharge instructions with patient and family. All questions answered.   Paperwork and prescriptions given.    NT rolled patient down with all belongings to family car.      [place], Therapist, sports

## 2019-12-17 NOTE — Progress Notes (Signed)
Physical Therapy Treatment Patient Details Name: Jimmy Conner MRN: [id number] DOB: Nov 14, 1945 Today's Date: 12/17/2019    History of Present Illness 74 yo male with Left hip femoral neck fracture / pain, s/p L DA THA per Dr Alvan Dame on 12/15/19. PMH: HIV, HTN    PT Comments    Reviewed THA HEP, ambulation, and stair training with pt and his significant other. He is ready to DC home from PT standpoint.   Follow Up Recommendations  Home health PT;Supervision for mobility/OOB     Equipment Recommendations  Rolling walker with 5" wheels;3in1 (PT)    Recommendations for Other Services       Precautions / Restrictions Precautions Precautions: Fall Restrictions LLE Weight Bearing: Partial weight bearing LLE Partial Weight Bearing Percentage or Pounds: 50%    Mobility  Bed Mobility               General bed mobility comments: up in recliner  Transfers Overall transfer level: Needs assistance Equipment used: Rolling walker (2 wheeled) Transfers: Sit to/from Stand Sit to Stand: Supervision         General transfer comment: cues for hand placement and safety  Ambulation/Gait Ambulation/Gait assistance: Supervision Gait Distance (Feet): 130 Feet Assistive device: 4-wheeled walker([facility] was slow to order RW, so girlfriend privately purchased a rollator) Gait Pattern/deviations: Step-to pattern;Trunk flexed Gait velocity: decr   General Gait Details: cues for sequence, cues for PWB, no LOB   Stairs Stairs: Yes Stairs assistance: Min assist Stair Management: Backwards;With walker Number of Stairs: 1 General stair comments: 1 step backwards with rollator, VCs sequencing, min A to manage rollator, girlfriend present   Wheelchair Mobility    Modified Rankin (Stroke Patients Only)       Balance Overall balance assessment: Modified Independent                                          Cognition Arousal/Alertness: Awake/alert Behavior During  Therapy: WFL for tasks assessed/performed Overall Cognitive Status: Within Functional Limits for tasks assessed                                        Exercises Total Joint Exercises Ankle Circles/Pumps: AROM;Both;10 reps Quad Sets: AROM;Both;5 reps Short Arc Quad: AROM;Left;10 reps;Supine Heel Slides: AAROM;Left;10 reps Hip ABduction/ADduction: AAROM;Left;10 reps Long Arc Quad: AROM;Left;10 reps;Seated    General Comments        Pertinent Vitals/Pain Pain Score: 4  Pain Location: L hip Pain Descriptors / Indicators: Sore Pain Intervention(s): Limited activity within patient's tolerance;Monitored during session;Ice applied    Home Living                      Prior Function            PT Goals (current goals can now be found in the care plan section) Acute Rehab PT Goals Patient Stated Goal: go back home soon PT Goal Formulation: With patient Time For Goal Achievement: 12/23/19 Potential to Achieve Goals: Good    Frequency    7X/week      PT Plan Current plan remains appropriate    Co-evaluation              AM-PAC PT "6 Clicks" Mobility   Outcome Measure  Help needed turning from your  back to your side while in a flat bed without using bedrails?: A Little Help needed moving from lying on your back to sitting on the side of a flat bed without using bedrails?: None Help needed moving to and from a bed to a chair (including a wheelchair)?: A Little Help needed standing up from a chair using your arms (e.g., wheelchair or bedside chair)?: None Help needed to walk in hospital room?: A Little Help needed climbing 3-5 steps with a railing? : A Little 6 Click Score: 20    End of Session Equipment Utilized During Treatment: Gait belt Activity Tolerance: Patient tolerated treatment well Patient left: with call bell/phone within reach;in chair;with family/visitor present;with chair alarm set Nurse Communication: Mobility status PT  Visit Diagnosis: Difficulty in walking, not elsewhere classified (R26.2);History of falling (Z91.81)     Time: [id number] PT Time Calculation (min) (ACUTE ONLY): 23 min  Charges:  $Gait Training: 8-22 mins $Therapeutic Exercise: 8-22 mins                    Blondell Reveal Kistler PT 12/17/2019  Acute Rehabilitation Services Pager [phone] Office [phone]
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
## 2020-02-15 ENCOUNTER — Other Ambulatory Visit (HOSPITAL_COMMUNITY)
Admission: RE | Admit: 2020-02-15 | Discharge: 2020-02-15 | Disposition: A | Discharge status: Home / Self Care | Payer: Medicare Other | Source: Ambulatory Visit | Attending: Orthopedic Surgery | Admitting: Orthopedic Surgery

## 2020-02-15 DIAGNOSIS — Z20822 Contact with and (suspected) exposure to covid-19: Secondary | ICD-10-CM | POA: Insufficient documentation

## 2020-02-15 DIAGNOSIS — Z01812 Encounter for preprocedural laboratory examination: Secondary | ICD-10-CM | POA: Diagnosis present

## 2020-02-15 LAB — SARS CORONAVIRUS 2 (TAT 6-24 HRS): SARS Coronavirus 2: NEGATIVE

## 2020-02-15 NOTE — Patient Instructions (Signed)
DUE TO COVID-19 ONLY ONE VISITOR IS ALLOWED TO COME WITH YOU AND STAY IN THE WAITING ROOM ONLY DURING PRE OP AND PROCEDURE DAY OF SURGERY. THE 1 VISITOR MAY VISIT WITH YOU AFTER SURGERY IN YOUR PRIVATE ROOM DURING VISITING HOURS ONLY!  YOU NEED TO HAVE A COVID 19 TEST ON: 02/15/20, THIS TEST MUST BE DONE BEFORE SURGERY, COME  [place], [place] , [address].  ([place]) ONCE YOUR COVID TEST IS COMPLETED, PLEASE BEGIN THE QUARANTINE INSTRUCTIONS AS OUTLINED IN YOUR HANDOUT.                Jimmy Conner    Your procedure is scheduled on: 02/18/20   Report to [facility] Main  Entrance   Report to admitting at: 9:00 AM     Call this number if you have problems the morning of surgery [phone]    Remember:   NO SOLID FOOD AFTER MIDNIGHT THE NIGHT PRIOR TO SURGERY. NOTHING BY MOUTH EXCEPT CLEAR LIQUIDS UNTIL: 8:30 am . PLEASE FINISH ENSURE DRINK PER SURGEON ORDER  WHICH NEEDS TO BE COMPLETED AT: 8:30 am .   CLEAR LIQUID DIET   Foods Allowed                                                                     Foods Excluded  Coffee and tea, regular and decaf                             liquids that you cannot  Plain Jell-O any favor except red or purple                                           see through such as: Fruit ices (not with fruit pulp)                                     milk, soups, orange juice  Iced Popsicles                                    All solid food Carbonated beverages, regular and diet                                    Cranberry, grape and apple juices Sports drinks like Gatorade Lightly seasoned clear broth or consume(fat free) Sugar, honey syrup  Sample Menu Breakfast                                Lunch                                     Supper Cranberry juice  Beef broth                            Chicken broth Jell-O                                     Grape juice                           Apple  juice Coffee or tea                        Jell-O                                      Popsicle                                                Coffee or tea                        Coffee or tea  _____________________________________________________________________   BRUSH YOUR TEETH MORNING OF SURGERY AND RINSE YOUR MOUTH OUT, NO CHEWING GUM CANDY OR MINTS.     Take these medicines the morning of surgery with A SIP OF WATER: abacavir,amlodipine,tenofovir.                                 You may not have any metal on your body including hair pins and              piercings  Do not wear jewelry, lotions, powders or perfumes, deodorant             Men may shave face and neck.   Do not bring valuables to the hospital. [place].  Contacts, dentures or bridgework may not be worn into surgery.  Leave suitcase in the car. After surgery it may be brought to your room.     Patients discharged the day of surgery will not be allowed to drive home. IF YOU ARE HAVING SURGERY AND GOING HOME THE SAME DAY, YOU MUST HAVE AN ADULT TO DRIVE YOU HOME AND BE WITH YOU FOR 24 HOURS. YOU MAY GO HOME BY TAXI OR UBER OR ORTHERWISE, BUT AN ADULT MUST ACCOMPANY YOU HOME AND STAY WITH YOU FOR 24 HOURS.  Name and phone number of your driver:  Special Instructions: N/A              Please read over the following fact sheets you were given: _____________________________________________________________________  [facility] - Preparing for Surgery Before surgery, you can play an important role.  Because skin is not sterile, your skin needs to be as free of germs as possible.  You can reduce the number of germs on your skin by washing with CHG (chlorahexidine gluconate) soap before surgery.  CHG is an antiseptic cleaner which kills germs and bonds with the skin to continue killing germs even after washing. Please DO  NOT use if you have an allergy to CHG or antibacterial  soaps.  If your skin becomes reddened/irritated stop using the CHG and inform your nurse when you arrive at Short Stay. Do not shave (including legs and underarms) for at least 48 hours prior to the first CHG shower.  You may shave your face/neck. Please follow these instructions carefully:  1.  Shower with CHG Soap the night before surgery and the  morning of Surgery.  2.  If you choose to wash your hair, wash your hair first as usual with your  normal  shampoo.  3.  After you shampoo, rinse your hair and body thoroughly to remove the  shampoo.                           4.  Use CHG as you would any other liquid soap.  You can apply chg directly  to the skin and wash                       Gently with a scrungie or clean washcloth.  5.  Apply the CHG Soap to your body ONLY FROM THE NECK DOWN.   Do not use on face/ open                           Wound or open sores. Avoid contact with eyes, ears mouth and genitals (private parts).                       Wash face,  Genitals (private parts) with your normal soap.             6.  Wash thoroughly, paying special attention to the area where your surgery  will be performed.  7.  Thoroughly rinse your body with warm water from the neck down.  8.  DO NOT shower/wash with your normal soap after using and rinsing off  the CHG Soap.                9.  Pat yourself dry with a clean towel.            10.  Wear clean pajamas.            11.  Place clean sheets on your bed the night of your first shower and do not  sleep with pets. Day of Surgery : Do not apply any lotions/deodorants the morning of surgery.  Please wear clean clothes to the hospital/surgery center.  FAILURE TO FOLLOW THESE INSTRUCTIONS MAY RESULT IN THE CANCELLATION OF YOUR SURGERY PATIENT SIGNATURE_________________________________  NURSE SIGNATURE__________________________________  ________________________________________________________________________   Adam Phenix  An  incentive spirometer is a tool that can help keep your lungs clear and active. This tool measures how well you are filling your lungs with each breath. Taking long deep breaths may help reverse or decrease the chance of developing breathing (pulmonary) problems (especially infection) following:  A long period of time when you are unable to move or be active. BEFORE THE PROCEDURE   If the spirometer includes an indicator to show your best effort, your nurse or respiratory therapist will set it to a desired goal.  If possible, sit up straight or lean slightly forward. Try not to slouch.  Hold the incentive spirometer in an upright position. INSTRUCTIONS FOR USE  1. Sit on the edge of your bed if  possible, or sit up as far as you can in bed or on a chair. 2. Hold the incentive spirometer in an upright position. 3. Breathe out normally. 4. Place the mouthpiece in your mouth and seal your lips tightly around it. 5. Breathe in slowly and as deeply as possible, raising the piston or the ball toward the top of the column. 6. Hold your breath for 3-5 seconds or for as long as possible. Allow the piston or ball to fall to the bottom of the column. 7. Remove the mouthpiece from your mouth and breathe out normally. 8. Rest for a few seconds and repeat Steps 1 through 7 at least 10 times every 1-2 hours when you are awake. Take your time and take a few normal breaths between deep breaths. 9. The spirometer may include an indicator to show your best effort. Use the indicator as a goal to work toward during each repetition. 10. After each set of 10 deep breaths, practice coughing to be sure your lungs are clear. If you have an incision (the cut made at the time of surgery), support your incision when coughing by placing a pillow or rolled up towels firmly against it. Once you are able to get out of bed, walk around indoors and cough well. You may stop using the incentive spirometer when instructed by your  caregiver.  RISKS AND COMPLICATIONS  Take your time so you do not get dizzy or light-headed.  If you are in pain, you may need to take or ask for pain medication before doing incentive spirometry. It is harder to take a deep breath if you are having pain. AFTER USE  Rest and breathe slowly and easily.  It can be helpful to keep track of a log of your progress. Your caregiver can provide you with a simple table to help with this. If you are using the spirometer at home, follow these instructions: [place] IF:   You are having difficultly using the spirometer.  You have trouble using the spirometer as often as instructed.  Your pain medication is not giving enough relief while using the spirometer.  You develop fever of 100.5 F (38.1 C) or higher. SEEK IMMEDIATE MEDICAL CARE IF:   You cough up bloody sputum that had not been present before.  You develop fever of 102 F (38.9 C) or greater.  You develop worsening pain at or near the incision site. MAKE SURE YOU:   Understand these instructions.  Will watch your condition.  Will get help right away if you are not doing well or get worse. Document Released: 12/31/2006 Document Revised: 11/12/2011 Document Reviewed: 03/03/2007 [facility] Patient Information 2014 [place], [place].   ________________________________________________________________________

## 2020-02-16 ENCOUNTER — Other Ambulatory Visit: Payer: Self-pay

## 2020-02-16 ENCOUNTER — Encounter (HOSPITAL_COMMUNITY)
Admission: RE | Admit: 2020-02-16 | Discharge: 2020-02-16 | Disposition: A | Discharge status: Home / Self Care | Payer: Medicare Other | Source: Ambulatory Visit | Attending: Orthopedic Surgery | Admitting: Orthopedic Surgery

## 2020-02-16 ENCOUNTER — Encounter (HOSPITAL_COMMUNITY): Payer: Self-pay

## 2020-02-16 DIAGNOSIS — Z01818 Encounter for other preprocedural examination: Secondary | ICD-10-CM | POA: Insufficient documentation

## 2020-02-16 HISTORY — DX: Pneumonia, unspecified organism: J18.9

## 2020-02-16 LAB — CBC
HCT: 37.8 % — ABNORMAL LOW (ref 39.0–52.0)
Hemoglobin: 12.1 g/dL — ABNORMAL LOW (ref 13.0–17.0)
MCH: 30 pg (ref 26.0–34.0)
MCHC: 32 g/dL (ref 30.0–36.0)
MCV: 93.6 fL (ref 80.0–100.0)
Platelets: 227 10*3/uL (ref 150–400)
RBC: 4.04 MIL/uL — ABNORMAL LOW (ref 4.22–5.81)
RDW: 13.3 % (ref 11.5–15.5)
WBC: 6.2 10*3/uL (ref 4.0–10.5)
nRBC: 0 % (ref 0.0–0.2)

## 2020-02-16 LAB — BASIC METABOLIC PANEL
Anion gap: 8 (ref 5–15)
BUN: 18 mg/dL (ref 8–23)
CO2: 28 mmol/L (ref 22–32)
Calcium: 8.9 mg/dL (ref 8.9–10.3)
Chloride: 104 mmol/L (ref 98–111)
Creatinine, Ser: 1.17 mg/dL (ref 0.61–1.24)
GFR calc Af Amer: >60 mL/min (ref >60)
GFR calc non Af Amer: >60 mL/min (ref >60)
Glucose, Bld: 102 mg/dL — ABNORMAL HIGH (ref 70–99)
Potassium: 4.2 mmol/L (ref 3.5–5.1)
Sodium: 140 mmol/L (ref 135–145)

## 2020-02-16 LAB — SURGICAL PCR SCREEN
MRSA, PCR: NEGATIVE
Staphylococcus aureus: NEGATIVE

## 2020-02-16 NOTE — Progress Notes (Signed)
COVID Vaccine Completed:yes Date COVID Vaccine completed: COVID vaccine manufacturer: [place]   PCP -  [facility] clinic Cardiologist -   Chest x-ray -  EKG - 12/11/19. Epic Stress Test -  ECHO -  Cardiac Cath -   Sleep Study -  CPAP -   Fasting Blood Sugar -  Checks Blood Sugar _____ times a day  Blood Thinner Instructions: Aspirin Instructions: Last Dose:  Anesthesia review:   Patient denies shortness of breath, fever, cough and chest pain at PAT appointment   Patient verbalized understanding of instructions that were given to them at the PAT appointment. Patient was also instructed that they will need to review over the PAT instructions again at home before surgery.

## 2020-02-17 NOTE — Anesthesia Preprocedure Evaluation (Addendum)
Anesthesia Evaluation  Patient identified by MRN, date of birth, ID band Patient awake    Reviewed: Allergy & Precautions, NPO status , Patient's Chart, lab work & pertinent test results  Airway Mallampati: II  TM Distance: >3 FB Neck ROM: Full    Dental no notable dental hx. (+) Poor Dentition, Dental Advisory Given, Missing, Chipped,    Pulmonary neg pulmonary ROS,    Pulmonary exam normal breath sounds clear to auscultation       Cardiovascular hypertension, Pt. on medications negative cardio ROS Normal cardiovascular exam Rhythm:Regular Rate:Normal     Neuro/Psych negative neurological ROS  negative psych ROS   GI/Hepatic negative GI ROS, (+) Hepatitis -  Endo/Other  negative endocrine ROS  Renal/GU negative Renal ROS  negative genitourinary   Musculoskeletal negative musculoskeletal ROS (+)   Abdominal   Peds  Hematology  (+) HIV,   Anesthesia Other Findings   Reproductive/Obstetrics                            Anesthesia Physical Anesthesia Plan  ASA: II  Anesthesia Plan: Spinal   Post-op Pain Management:    Induction:   PONV Risk Score and Plan: 1 and Treatment may vary due to age or medical condition, Propofol infusion, Ondansetron and Dexamethasone  Airway Management Planned: Natural Airway  Additional Equipment:   Intra-op Plan:   Post-operative Plan:   Informed Consent: I have reviewed the patients History and Physical, chart, labs and discussed the procedure including the risks, benefits and alternatives for the proposed anesthesia with the patient or authorized representative who has indicated his/her understanding and acceptance.     Dental advisory given  Plan Discussed with: CRNA  Anesthesia Plan Comments:         Anesthesia Quick Evaluation
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
## 2020-02-18 ENCOUNTER — Inpatient Hospital Stay (HOSPITAL_COMMUNITY): Payer: No Typology Code available for payment source

## 2020-02-18 ENCOUNTER — Other Ambulatory Visit: Payer: Self-pay

## 2020-02-18 ENCOUNTER — Encounter (HOSPITAL_COMMUNITY)
Admission: RE | Disposition: A | Discharge status: Skilled Nursing Facility | Payer: Self-pay | Source: Home / Self Care | Attending: Orthopedic Surgery

## 2020-02-18 ENCOUNTER — Encounter (HOSPITAL_COMMUNITY): Payer: Self-pay | Admitting: Orthopedic Surgery

## 2020-02-18 ENCOUNTER — Inpatient Hospital Stay (HOSPITAL_COMMUNITY): Payer: No Typology Code available for payment source | Admitting: Anesthesiology

## 2020-02-18 ENCOUNTER — Observation Stay (HOSPITAL_COMMUNITY): Payer: No Typology Code available for payment source

## 2020-02-18 ENCOUNTER — Inpatient Hospital Stay (HOSPITAL_COMMUNITY)
Admission: RE | Admit: 2020-02-18 | Discharge: 2020-02-24 | DRG: 468 | Disposition: A | Discharge status: Skilled Nursing Facility | Payer: No Typology Code available for payment source | Attending: Orthopedic Surgery | Admitting: Orthopedic Surgery

## 2020-02-18 DIAGNOSIS — Z21 Asymptomatic human immunodeficiency virus [HIV] infection status: Secondary | ICD-10-CM | POA: Diagnosis present

## 2020-02-18 DIAGNOSIS — Z9081 Acquired absence of spleen: Secondary | ICD-10-CM | POA: Diagnosis not present

## 2020-02-18 DIAGNOSIS — Z85828 Personal history of other malignant neoplasm of skin: Secondary | ICD-10-CM | POA: Diagnosis not present

## 2020-02-18 DIAGNOSIS — Z79899 Other long term (current) drug therapy: Secondary | ICD-10-CM | POA: Diagnosis not present

## 2020-02-18 DIAGNOSIS — Z87442 Personal history of urinary calculi: Secondary | ICD-10-CM

## 2020-02-18 DIAGNOSIS — Z20822 Contact with and (suspected) exposure to covid-19: Secondary | ICD-10-CM | POA: Diagnosis present

## 2020-02-18 DIAGNOSIS — Y792 Prosthetic and other implants, materials and accessory orthopedic devices associated with adverse incidents: Secondary | ICD-10-CM | POA: Diagnosis present

## 2020-02-18 DIAGNOSIS — M247 Protrusio acetabuli: Secondary | ICD-10-CM | POA: Diagnosis present

## 2020-02-18 DIAGNOSIS — Z885 Allergy status to narcotic agent status: Secondary | ICD-10-CM | POA: Diagnosis not present

## 2020-02-18 DIAGNOSIS — I10 Essential (primary) hypertension: Secondary | ICD-10-CM | POA: Diagnosis present

## 2020-02-18 DIAGNOSIS — Z8546 Personal history of malignant neoplasm of prostate: Secondary | ICD-10-CM

## 2020-02-18 DIAGNOSIS — M1990 Unspecified osteoarthritis, unspecified site: Secondary | ICD-10-CM | POA: Diagnosis present

## 2020-02-18 DIAGNOSIS — Z96649 Presence of unspecified artificial hip joint: Secondary | ICD-10-CM

## 2020-02-18 DIAGNOSIS — M25552 Pain in left hip: Secondary | ICD-10-CM | POA: Diagnosis present

## 2020-02-18 DIAGNOSIS — T84091A Other mechanical complication of internal left hip prosthesis, initial encounter: Principal | ICD-10-CM | POA: Diagnosis present

## 2020-02-18 DIAGNOSIS — S72041A Displaced fracture of base of neck of right femur, initial encounter for closed fracture: Secondary | ICD-10-CM

## 2020-02-18 HISTORY — PX: ACETABULAR REVISION: SHX5712

## 2020-02-18 LAB — TYPE AND SCREEN
ABO/RH(D): A NEG
Antibody Screen: NEGATIVE

## 2020-02-18 SURGERY — REVISION, TOTAL ARTHROPLASTY, HIP, ACETABULAR COMPONENT
Anesthesia: Spinal | Site: Hip | Laterality: Left

## 2020-02-18 MED ORDER — TENOFOVIR ALAFENAMIDE FUMARATE 25 MG PO TABS
25.0000 mg | ORAL_TABLET | Freq: Every day | ORAL | Status: DC
  Administered 2020-02-19 – 2020-02-24 (×6): 25 mg via ORAL
  Filled 2020-02-18 (×6): qty 1

## 2020-02-18 MED ORDER — HYDROMORPHONE HCL 1 MG/ML IJ SOLN: 0.5000 mg | INTRAMUSCULAR | Status: DC | PRN

## 2020-02-18 MED ORDER — STERILE WATER FOR IRRIGATION IR SOLN
Status: DC | PRN
  Administered 2020-02-18: 2000 mL

## 2020-02-18 MED ORDER — ABACAVIR-DOLUTEGRAVIR-LAMIVUD 600-50-300 MG PO TABS
1.0000 | ORAL_TABLET | Freq: Every day | ORAL | Status: DC
  Administered 2020-02-19 – 2020-02-24 (×6): 1 via ORAL
  Filled 2020-02-18 (×6): qty 1

## 2020-02-18 MED ORDER — CEFAZOLIN SODIUM-DEXTROSE 2-4 GM/100ML-% IV SOLN
2.0000 g | Freq: Four times a day (QID) | INTRAVENOUS | Status: AC
  Administered 2020-02-18 – 2020-02-19 (×2): 2 g via INTRAVENOUS
  Filled 2020-02-18 (×2): qty 100

## 2020-02-18 MED ORDER — CHLORHEXIDINE GLUCONATE 0.12 % MT SOLN
15.0000 mL | Freq: Once | OROMUCOSAL | Status: AC
  Administered 2020-02-18: 15 mL via OROMUCOSAL

## 2020-02-18 MED ORDER — BISACODYL 10 MG RE SUPP: 10.0000 mg | Freq: Every day | RECTAL | Status: DC | PRN

## 2020-02-18 MED ORDER — METHOCARBAMOL 500 MG PO TABS
500.0000 mg | ORAL_TABLET | Freq: Four times a day (QID) | ORAL | Status: DC | PRN
  Administered 2020-02-18 – 2020-02-23 (×8): 500 mg via ORAL
  Filled 2020-02-18 (×8): qty 1

## 2020-02-18 MED ORDER — TRAMADOL HCL 50 MG PO TABS
50.0000 mg | ORAL_TABLET | Freq: Four times a day (QID) | ORAL | Status: DC | PRN
  Administered 2020-02-18 – 2020-02-19 (×4): 100 mg via ORAL
  Administered 2020-02-20 – 2020-02-21 (×3): 50 mg via ORAL
  Administered 2020-02-23: 100 mg via ORAL
  Filled 2020-02-18: qty 2
  Filled 2020-02-18: qty 1
  Filled 2020-02-18 (×2): qty 2
  Filled 2020-02-18: qty 1
  Filled 2020-02-18: qty 2
  Filled 2020-02-18: qty 1
  Filled 2020-02-18 (×2): qty 2

## 2020-02-18 MED ORDER — CELECOXIB 200 MG PO CAPS
200.0000 mg | ORAL_CAPSULE | Freq: Two times a day (BID) | ORAL | Status: DC
  Administered 2020-02-18 – 2020-02-24 (×12): 200 mg via ORAL
  Filled 2020-02-18 (×12): qty 1

## 2020-02-18 MED ORDER — SULFAMETHOXAZOLE-TRIMETHOPRIM 800-160 MG PO TABS
1.0000 | ORAL_TABLET | ORAL | Status: DC
  Administered 2020-02-19 – 2020-02-24 (×3): 1 via ORAL
  Filled 2020-02-18 (×3): qty 1

## 2020-02-18 MED ORDER — METOCLOPRAMIDE HCL 5 MG/ML IJ SOLN: 5.0000 mg | Freq: Three times a day (TID) | INTRAMUSCULAR | Status: DC | PRN

## 2020-02-18 MED ORDER — MENTHOL 3 MG MT LOZG: 1.0000 | LOZENGE | OROMUCOSAL | Status: DC | PRN

## 2020-02-18 MED ORDER — 0.9 % SODIUM CHLORIDE (POUR BTL) OPTIME
TOPICAL | Status: DC | PRN
  Administered 2020-02-18: 1000 mL

## 2020-02-18 MED ORDER — PHENYLEPHRINE 40 MCG/ML (10ML) SYRINGE FOR IV PUSH (FOR BLOOD PRESSURE SUPPORT)
PREFILLED_SYRINGE | INTRAVENOUS | Status: DC | PRN
  Administered 2020-02-18 (×2): 100 ug via INTRAVENOUS

## 2020-02-18 MED ORDER — DEXAMETHASONE SODIUM PHOSPHATE 10 MG/ML IJ SOLN
10.0000 mg | Freq: Once | INTRAMUSCULAR | Status: AC
  Administered 2020-02-19: 10 mg via INTRAVENOUS
  Filled 2020-02-18: qty 1

## 2020-02-18 MED ORDER — TRANEXAMIC ACID-NACL 1000-0.7 MG/100ML-% IV SOLN
1000.0000 mg | INTRAVENOUS | Status: AC
  Administered 2020-02-18: 1000 mg via INTRAVENOUS
  Filled 2020-02-18: qty 100

## 2020-02-18 MED ORDER — ONDANSETRON HCL 4 MG PO TABS: 4.0000 mg | ORAL_TABLET | Freq: Four times a day (QID) | ORAL | Status: DC | PRN

## 2020-02-18 MED ORDER — PROPOFOL 10 MG/ML IV BOLUS
INTRAVENOUS | Status: DC | PRN
Start: 2020-02-18 — End: 2020-02-18
  Administered 2020-02-18: 30 mg via INTRAVENOUS

## 2020-02-18 MED ORDER — AMLODIPINE BESYLATE 5 MG PO TABS
5.0000 mg | ORAL_TABLET | Freq: Every day | ORAL | Status: DC
  Administered 2020-02-19 – 2020-02-24 (×6): 5 mg via ORAL
  Filled 2020-02-18 (×6): qty 1

## 2020-02-18 MED ORDER — ONDANSETRON HCL 4 MG/2ML IJ SOLN
INTRAMUSCULAR | Status: DC | PRN
  Administered 2020-02-18: 4 mg via INTRAVENOUS

## 2020-02-18 MED ORDER — MIDAZOLAM HCL 2 MG/2ML IJ SOLN
INTRAMUSCULAR | Status: DC | PRN
  Administered 2020-02-18: 2 mg via INTRAVENOUS

## 2020-02-18 MED ORDER — POLYETHYLENE GLYCOL 3350 17 G PO PACK
17.0000 g | PACK | Freq: Two times a day (BID) | ORAL | Status: DC
  Administered 2020-02-18 – 2020-02-20 (×3): 17 g via ORAL
  Filled 2020-02-18 (×8): qty 1

## 2020-02-18 MED ORDER — TRANEXAMIC ACID-NACL 1000-0.7 MG/100ML-% IV SOLN
1000.0000 mg | Freq: Once | INTRAVENOUS | Status: AC
  Administered 2020-02-18: 1000 mg via INTRAVENOUS
  Filled 2020-02-18: qty 100

## 2020-02-18 MED ORDER — ASPIRIN 81 MG PO CHEW
81.0000 mg | CHEWABLE_TABLET | Freq: Two times a day (BID) | ORAL | Status: DC
  Administered 2020-02-18 – 2020-02-24 (×12): 81 mg via ORAL
  Filled 2020-02-18 (×12): qty 1

## 2020-02-18 MED ORDER — ORAL CARE MOUTH RINSE: 15.0000 mL | Freq: Once | OROMUCOSAL | Status: AC

## 2020-02-18 MED ORDER — DEXAMETHASONE SODIUM PHOSPHATE 10 MG/ML IJ SOLN
10.0000 mg | Freq: Once | INTRAMUSCULAR | Status: AC
  Administered 2020-02-18: 10 mg via INTRAVENOUS

## 2020-02-18 MED ORDER — DOCUSATE SODIUM 100 MG PO CAPS
100.0000 mg | ORAL_CAPSULE | Freq: Two times a day (BID) | ORAL | Status: DC
  Administered 2020-02-18 – 2020-02-23 (×10): 100 mg via ORAL
  Filled 2020-02-18 (×10): qty 1

## 2020-02-18 MED ORDER — DIPHENHYDRAMINE HCL 12.5 MG/5ML PO ELIX: 12.5000 mg | ORAL_SOLUTION | ORAL | Status: DC | PRN

## 2020-02-18 MED ORDER — FENTANYL CITRATE (PF) 100 MCG/2ML IJ SOLN
INTRAMUSCULAR | Status: AC
  Filled 2020-02-18: qty 2

## 2020-02-18 MED ORDER — BUPIVACAINE IN DEXTROSE 0.75-8.25 % IT SOLN
INTRATHECAL | Status: DC | PRN
  Administered 2020-02-18: 1.8 mL via INTRATHECAL

## 2020-02-18 MED ORDER — ACETAMINOPHEN 500 MG PO TABS
1000.0000 mg | ORAL_TABLET | Freq: Three times a day (TID) | ORAL | Status: DC
  Administered 2020-02-18 – 2020-02-24 (×18): 1000 mg via ORAL
  Filled 2020-02-18 (×18): qty 2

## 2020-02-18 MED ORDER — MAGNESIUM CITRATE PO SOLN: 1.0000 | Freq: Once | ORAL | Status: DC | PRN

## 2020-02-18 MED ORDER — LACTATED RINGERS IV SOLN
INTRAVENOUS | Status: DC | PRN
Start: 2020-02-18 — End: 2020-02-18

## 2020-02-18 MED ORDER — FERROUS SULFATE 325 (65 FE) MG PO TABS
325.0000 mg | ORAL_TABLET | Freq: Three times a day (TID) | ORAL | Status: DC
  Administered 2020-02-19 – 2020-02-24 (×16): 325 mg via ORAL
  Filled 2020-02-18 (×16): qty 1

## 2020-02-18 MED ORDER — FENTANYL CITRATE (PF) 100 MCG/2ML IJ SOLN: 25.0000 ug | INTRAMUSCULAR | Status: DC | PRN

## 2020-02-18 MED ORDER — ACETAMINOPHEN 500 MG PO TABS
1000.0000 mg | ORAL_TABLET | Freq: Once | ORAL | Status: AC
  Administered 2020-02-18: 1000 mg via ORAL
  Filled 2020-02-18: qty 2

## 2020-02-18 MED ORDER — ONDANSETRON HCL 4 MG/2ML IJ SOLN: 4.0000 mg | Freq: Four times a day (QID) | INTRAMUSCULAR | Status: DC | PRN

## 2020-02-18 MED ORDER — PHENYLEPHRINE HCL (PRESSORS) 10 MG/ML IV SOLN
INTRAVENOUS | Status: AC
  Filled 2020-02-18: qty 2

## 2020-02-18 MED ORDER — METHOCARBAMOL 500 MG IVPB - SIMPLE MED
500.0000 mg | Freq: Four times a day (QID) | INTRAVENOUS | Status: DC | PRN
  Filled 2020-02-18: qty 50

## 2020-02-18 MED ORDER — ALUM & MAG HYDROXIDE-SIMETH 200-200-20 MG/5ML PO SUSP: 15.0000 mL | ORAL | Status: DC | PRN

## 2020-02-18 MED ORDER — PROPOFOL 500 MG/50ML IV EMUL
INTRAVENOUS | Status: DC | PRN
Start: 2020-02-18 — End: 2020-02-18
  Administered 2020-02-18: 75 ug/kg/min via INTRAVENOUS

## 2020-02-18 MED ORDER — METOCLOPRAMIDE HCL 5 MG PO TABS: 5.0000 mg | ORAL_TABLET | Freq: Three times a day (TID) | ORAL | Status: DC | PRN

## 2020-02-18 MED ORDER — SODIUM CHLORIDE 0.9 % IV SOLN: INTRAVENOUS | Status: DC

## 2020-02-18 MED ORDER — PHENOL 1.4 % MT LIQD: 1.0000 | OROMUCOSAL | Status: DC | PRN

## 2020-02-18 MED ORDER — MIDAZOLAM HCL 2 MG/2ML IJ SOLN
INTRAMUSCULAR | Status: AC
  Filled 2020-02-18: qty 2

## 2020-02-18 MED ORDER — PHENYLEPHRINE HCL-NACL 20-0.9 MG/250ML-% IV SOLN
INTRAVENOUS | Status: DC | PRN
  Administered 2020-02-18: 50 ug/min via INTRAVENOUS

## 2020-02-18 MED ORDER — CEFAZOLIN SODIUM-DEXTROSE 2-4 GM/100ML-% IV SOLN
2.0000 g | INTRAVENOUS | Status: AC
  Administered 2020-02-18: 2 g via INTRAVENOUS
  Filled 2020-02-18: qty 100

## 2020-02-18 SURGICAL SUPPLY — 73 items
ADH SKN CLS APL DERMABOND .7 (GAUZE/BANDAGES/DRESSINGS) ×1
BAG SPEC THK2 15X12 ZIP CLS (MISCELLANEOUS) ×1
BAG ZIPLOCK 12X15 (MISCELLANEOUS) ×2 IMPLANT
BLADE SAW SGTL 11.0X1.19X90.0M (BLADE) IMPLANT
BLADE SAW SGTL 18X1.27X75 (BLADE) IMPLANT
BRUSH FEMORAL CANAL (MISCELLANEOUS) IMPLANT
COVER SURGICAL LIGHT HANDLE (MISCELLANEOUS) ×2 IMPLANT
COVER WAND RF STERILE (DRAPES) IMPLANT
CUP ACETAB 56MM (Orthopedic Implant) ×1 IMPLANT
DERMABOND ADVANCED (GAUZE/BANDAGES/DRESSINGS) ×1
DERMABOND ADVANCED .7 DNX12 (GAUZE/BANDAGES/DRESSINGS) ×1 IMPLANT
DRAPE ORTHO SPLIT 77X108 STRL (DRAPES) ×4
DRAPE POUCH INSTRU U-SHP 10X18 (DRAPES) ×2 IMPLANT
DRAPE SURG 17X11 SM STRL (DRAPES) ×2 IMPLANT
DRAPE SURG ORHT 6 SPLT 77X108 (DRAPES) ×2 IMPLANT
DRAPE U-SHAPE 47X51 STRL (DRAPES) ×2 IMPLANT
DRESSING AQUACEL AG SP 3.5X10 (GAUZE/BANDAGES/DRESSINGS) IMPLANT
DRSG AQUACEL AG ADV 3.5X10 (GAUZE/BANDAGES/DRESSINGS) ×2 IMPLANT
DRSG AQUACEL AG ADV 3.5X14 (GAUZE/BANDAGES/DRESSINGS) ×2 IMPLANT
DRSG AQUACEL AG SP 3.5X10 (GAUZE/BANDAGES/DRESSINGS) ×2
DURAPREP 26ML APPLICATOR (WOUND CARE) ×2 IMPLANT
ELECT BLADE TIP CTD 4 INCH (ELECTRODE) ×2 IMPLANT
ELECT REM PT RETURN 15FT ADLT (MISCELLANEOUS) ×2 IMPLANT
EVACUATOR 1/8 PVC DRAIN (DRAIN) IMPLANT
FACESHIELD WRAPAROUND (MASK) ×8 IMPLANT
FACESHIELD WRAPAROUND OR TEAM (MASK) ×4 IMPLANT
GLOVE BIOGEL M 7.0 STRL (GLOVE) IMPLANT
GLOVE BIOGEL PI IND STRL 7.5 (GLOVE) ×1 IMPLANT
GLOVE BIOGEL PI IND STRL 8.5 (GLOVE) ×1 IMPLANT
GLOVE BIOGEL PI INDICATOR 7.5 (GLOVE) ×1
GLOVE BIOGEL PI INDICATOR 8.5 (GLOVE) ×1
GLOVE ECLIPSE 8.0 STRL XLNG CF (GLOVE) ×4 IMPLANT
GLOVE ORTHO TXT STRL SZ7.5 (GLOVE) ×4 IMPLANT
GOWN STRL REUS W/TWL LRG LVL3 (GOWN DISPOSABLE) ×2 IMPLANT
GOWN STRL REUS W/TWL XL LVL3 (GOWN DISPOSABLE) ×2 IMPLANT
GRAFT DBM PARTICULATE CANC 10 (Bone Implant) IMPLANT
GRAFT DBM PARTICULATE CANC 15 (Bone Implant) IMPLANT
GRAFT IC CHAMBER LG (Bone Implant) ×2 IMPLANT
GRAFT IC CHAMBER MED (Bone Implant) ×2 IMPLANT
HANDPIECE INTERPULSE COAX TIP (DISPOSABLE) ×2
HEAD CERAMIC BIO DELTA 36 (Hips) ×1 IMPLANT
KIT BASIN (CUSTOM PROCEDURE TRAY) ×2 IMPLANT
KIT TURNOVER KIT A (KITS) IMPLANT
MANIFOLD NEPTUNE II (INSTRUMENTS) ×2 IMPLANT
NS IRRIG 1000ML POUR BTL (IV SOLUTION) ×4 IMPLANT
PACK TOTAL JOINT (CUSTOM PROCEDURE TRAY) ×2 IMPLANT
PENCIL SMOKE EVACUATOR (MISCELLANEOUS) IMPLANT
PINNACLE ALTRX PLUS 4 N 36X56 (Hips) ×1 IMPLANT
PRESSURIZER FEMORAL UNIV (MISCELLANEOUS) IMPLANT
PROTECTOR NERVE ULNAR (MISCELLANEOUS) ×2 IMPLANT
SCREW 6.5MMX25MM (Screw) ×1 IMPLANT
SCREW 6.5MMX35MM (Screw) ×1 IMPLANT
SCREW 6.5MMX40MM (Screw) IMPLANT
SCREW PINN CAN 6.5X20 (Screw) ×1 IMPLANT
SET HNDPC FAN SPRY TIP SCT (DISPOSABLE) ×1 IMPLANT
SPONGE LAP 18X18 RF (DISPOSABLE) ×2 IMPLANT
SPONGE LAP 4X18 RFD (DISPOSABLE) IMPLANT
STAPLER VISISTAT 35W (STAPLE) ×2 IMPLANT
SUCTION FRAZIER HANDLE 10FR (MISCELLANEOUS) ×2
SUCTION TUBE FRAZIER 10FR DISP (MISCELLANEOUS) ×1 IMPLANT
SUT MNCRL AB 3-0 PS2 18 (SUTURE) ×1 IMPLANT
SUT MNCRL AB 4-0 PS2 18 (SUTURE) ×1 IMPLANT
SUT STRATAFIX 0 PDS 27 VIOLET (SUTURE) ×2
SUT VIC AB 1 CT1 36 (SUTURE) ×4 IMPLANT
SUT VIC AB 2-0 CT1 27 (SUTURE) ×6
SUT VIC AB 2-0 CT1 TAPERPNT 27 (SUTURE) ×3 IMPLANT
SUT VLOC 180 0 24IN GS25 (SUTURE) ×2 IMPLANT
SUTURE STRATFX 0 PDS 27 VIOLET (SUTURE) IMPLANT
TOWEL OR 17X26 10 PK STRL BLUE (TOWEL DISPOSABLE) ×4 IMPLANT
TOWEL OR NON WOVEN STRL DISP B (DISPOSABLE) ×2 IMPLANT
TOWER CARTRIDGE SMART MIX (DISPOSABLE) IMPLANT
TRAY FOLEY MTR SLVR 16FR STAT (SET/KITS/TRAYS/PACK) ×2 IMPLANT
WATER STERILE IRR 1000ML POUR (IV SOLUTION) ×2 IMPLANT

## 2020-02-18 NOTE — Interval H&P Note (Signed)
History and Physical Interval Note:  02/18/2020 10:18 AM  Jimmy Conner  has presented today for surgery, with the diagnosis of Failed left acetabular component.  The various methods of treatment have been discussed with the patient and family. After consideration of risks, benefits and other options for treatment, the patient has consented to  Procedure(s) with comments: REVISION LEFT TOTAL HIP ARTHROPLASTY, ACETABULAR COMPONENT (Left) - 2 HRS as a surgical intervention.  The patient's history has been reviewed, patient examined, no change in status, stable for surgery.  I have reviewed the patient's chart and labs.  Questions were answered to the patient's satisfaction.     Mauri Pole

## 2020-02-18 NOTE — Brief Op Note (Signed)
02/18/2020  10:22 AM  PATIENT:  Dellis Anes  74 y.o. male  PRE-OPERATIVE DIAGNOSIS:  Failed left acetabular component  POST-OPERATIVE DIAGNOSIS:  Failed left acetabular component  PROCEDURE:  Procedure(s) with comments: REVISION LEFT TOTAL HIP ARTHROPLASTY, ACETABULAR COMPONENT (Left) - 2 HRS  SURGEON:  Surgeon(s) and Role:    Paralee Cancel, MD - Primary  PHYSICIAN ASSISTANT: Danae Orleans, PA-C  ANESTHESIA:   spinal  EBL:  400cc  BLOOD ADMINISTERED:none  DRAINS: none   LOCAL MEDICATIONS USED:  NONE  SPECIMEN:  No Specimen  DISPOSITION OF SPECIMEN:  N/A  COUNTS:  YES  TOURNIQUET:  * No tourniquets in log *  DICTATION: .Other Dictation: Dictation Number [id number]  PLAN OF CARE: Admit to inpatient   PATIENT DISPOSITION:  PACU - hemodynamically stable.   Delay start of Pharmacological VTE agent (>24hrs) due to surgical blood loss or risk of bleeding: no

## 2020-02-18 NOTE — Discharge Instructions (Addendum)
INSTRUCTIONS AFTER JOINT REPLACEMENT   o Remove items at home which could result in a fall. This includes throw rugs or furniture in walking pathways o ICE to the affected joint every three hours while awake for 30 minutes at a time, for at least the first 3-5 days, and then as needed for pain and swelling.  Continue to use ice for pain and swelling. You may notice swelling that will progress down to the foot and ankle.  This is normal after surgery.  Elevate your leg when you are not up walking on it.   o Continue to use the breathing machine you got in the hospital (incentive spirometer) which will help keep your temperature down.  It is common for your temperature to cycle up and down following surgery, especially at night when you are not up moving around and exerting yourself.  The breathing machine keeps your lungs expanded and your temperature down.   DIET:  As you were doing prior to hospitalization, we recommend a well-balanced diet.  DRESSING / WOUND CARE / SHOWERING  Keep the surgical dressing until follow up.  The dressing is water proof, so you can shower without any extra covering.  IF THE DRESSING FALLS OFF or the wound gets wet inside, change the dressing with sterile gauze.  Please use good hand washing techniques before changing the dressing.  Do not use any lotions or creams on the incision until instructed by your surgeon.    ACTIVITY  o Increase activity slowly as tolerated, but follow the weight bearing instructions below.   o No driving for 6 weeks or until further direction given by your physician.  You cannot drive while taking narcotics.  o No lifting or carrying greater than 10 lbs. until further directed by your surgeon. o Avoid periods of inactivity such as sitting longer than an hour when not asleep. This helps prevent blood clots.  o You may return to work once you are authorized by your doctor.     WEIGHT BEARING   Partial weight bearing with assist device as  directed.  touch down weight bearing left leg   EXERCISES  Results after joint replacement surgery are often greatly improved when you follow the exercise, range of motion and muscle strengthening exercises prescribed by your doctor. Safety measures are also important to protect the joint from further injury. Any time any of these exercises cause you to have increased pain or swelling, decrease what you are doing until you are comfortable again and then slowly increase them. If you have problems or questions, call your caregiver or physical therapist for advice.   Rehabilitation is important following a joint replacement. After just a few days of immobilization, the muscles of the leg can become weakened and shrink (atrophy).  These exercises are designed to build up the tone and strength of the thigh and leg muscles and to improve motion. Often times heat used for twenty to thirty minutes before working out will loosen up your tissues and help with improving the range of motion but do not use heat for the first two weeks following surgery (sometimes heat can increase post-operative swelling).   These exercises can be done on a training (exercise) mat, on the floor, on a table or on a bed. Use whatever works the best and is most comfortable for you.    Use music or television while you are exercising so that the exercises are a pleasant break in your day. This will make your  life better with the exercises acting as a break in your routine that you can look forward to.   Perform all exercises about fifteen times, three times per day or as directed.  You should exercise both the operative leg and the other leg as well.  Exercises include:   . Quad Sets - Tighten up the muscle on the front of the thigh (Quad) and hold for 5-10 seconds.   . Straight Leg Raises - With your knee straight (if you were given a brace, keep it on), lift the leg to 60 degrees, hold for 3 seconds, and slowly lower the leg.   Perform this exercise against resistance later as your leg gets stronger.  . Leg Slides: Lying on your back, slowly slide your foot toward your buttocks, bending your knee up off the floor (only go as far as is comfortable). Then slowly slide your foot back down until your leg is flat on the floor again.  [place] Haring Wings: Lying on your back spread your legs to the side as far apart as you can without causing discomfort.  . Hamstring Strength:  Lying on your back, push your heel against the floor with your leg straight by tightening up the muscles of your buttocks.  Repeat, but this time bend your knee to a comfortable angle, and push your heel against the floor.  You may put a pillow under the heel to make it more comfortable if necessary.   A rehabilitation program following joint replacement surgery can speed recovery and prevent re-injury in the future due to weakened muscles. Contact your doctor or a physical therapist for more information on knee rehabilitation.    CONSTIPATION  Constipation is defined medically as fewer than three stools per week and severe constipation as less than one stool per week.  Even if you have a regular bowel pattern at home, your normal regimen is likely to be disrupted due to multiple reasons following surgery.  Combination of anesthesia, postoperative narcotics, change in appetite and fluid intake all can affect your bowels.   YOU MUST use at least one of the following options; they are listed in order of increasing strength to get the job done.  They are all available over the counter, and you may need to use some, POSSIBLY even all of these options:    Drink plenty of fluids (prune juice may be helpful) and high fiber foods Colace 100 mg by mouth twice a day  Senokot for constipation as directed and as needed Dulcolax (bisacodyl), take with full glass of water  Miralax (polyethylene glycol) once or twice a day as needed.  If you have tried all these things and  are unable to have a bowel movement in the first 3-4 days after surgery call either your surgeon or your primary doctor.    If you experience loose stools or diarrhea, hold the medications until you stool forms back up.  If your symptoms do not get better within 1 week or if they get worse, check with your doctor.  If you experience "the worst abdominal pain ever" or develop nausea or vomiting, please contact the office immediately for further recommendations for treatment.   ITCHING:  If you experience itching with your medications, try taking only a single pain pill, or even half a pain pill at a time.  You can also use Benadryl over the counter for itching or also to help with sleep.   TED HOSE STOCKINGS:  Use stockings on both  legs until for at least 2 weeks or as directed by physician office. They may be removed at night for sleeping.  MEDICATIONS:  See your medication summary on the "After Visit Summary" that nursing will review with you.  You may have some home medications which will be placed on hold until you complete the course of blood thinner medication.  It is important for you to complete the blood thinner medication as prescribed.  PRECAUTIONS:  If you experience chest pain or shortness of breath - call 911 immediately for transfer to the hospital emergency department.   If you develop a fever greater that 101 F, purulent drainage from wound, increased redness or drainage from wound, foul odor from the wound/dressing, or calf pain - CONTACT YOUR SURGEON.                                                   FOLLOW-UP APPOINTMENTS:  If you do not already have a post-op appointment, please call the office for an appointment to be seen by your surgeon.  Guidelines for how soon to be seen are listed in your "After Visit Summary", but are typically between 1-4 weeks after surgery.  OTHER INSTRUCTIONS:   Knee Replacement:  Do not place pillow under knee, focus on keeping the knee straight  while resting.    MAKE SURE YOU:  . Understand these instructions.  . Get help right away if you are not doing well or get worse.    Thank you for letting us be a part of your medical care team.  It is a privilege we respect greatly.  We hope these instructions will help you stay on track for a fast and full recovery!

## 2020-02-18 NOTE — Evaluation (Addendum)
Physical Therapy Evaluation Patient Details Name: Jimmy Conner MRN: [id number] DOB: [birth year] Today's Date: 02/18/2020   History of Present Illness  Patient is 74 y.o. male s/p Lt THR (acetabular component revision) on 02/18/20 with PMH significant for prostate cancer, HTN, HIV, OA, anemia, colon cancer, initial Lt THA on 12/15/19.    Clinical Impression  Jimmy Conner is a 74 y.o. male POD 0 s/p Lt THR. Patient reports independence with mobility at baseline but he has required use of RW since THA in April. Patient is now limited by functional impairments (see PT problem list below) and requires min assist for transfers and gait with RW. Patient was able to ambulate ~70 feet with RW and min assist. Patient instructed in exercise to facilitate circulation. Patient will benefit from continued skilled PT interventions to address impairments and progress towards PLOF. Acute PT will follow to progress mobility and stair training in preparation for safe discharge home.     Follow Up Recommendations Follow surgeon's recommendation for DC plan and follow-up therapies    Equipment Recommendations  Rolling walker with 5" wheels    Recommendations for Other Services       Precautions / Restrictions Precautions Precautions: Fall Restrictions Weight Bearing Restrictions: Yes LLE Weight Bearing: Touchdown weight bearing Other Position/Activity Restrictions: Two orders for PWB (50%) and TDWB in chart.       Mobility  Bed Mobility Overal bed mobility: Needs Assistance Bed Mobility: Supine to Sit     Supine to sit: Min assist;HOB elevated     General bed mobility comments: cues to reach for bed rail, assist for Lt LE mobility, pt able to raise trunk.   Transfers Overall transfer level: Needs assistance Equipment used: Rolling walker (2 wheeled) Transfers: Sit to/from Stand Sit to Stand: Min assist         General transfer comment: cues for technique/hand placement, light assist to  steady with rise.  Ambulation/Gait Ambulation/Gait assistance: Min assist Gait Distance (Feet): 70 Feet Assistive device: Rolling walker (2 wheeled) Gait Pattern/deviations: Step-to pattern;Decreased stance time - left;Decreased weight shift to left;Decreased stride length Gait velocity: fair   General Gait Details: ceus for step pattern and to maintain TDWB on Lt LE at this time. Pt with good use of UE's to maintain TDWB.   Stairs       Wheelchair Mobility    Modified Rankin (Stroke Patients Only)       Balance Overall balance assessment: Needs assistance Sitting-balance support: Feet supported Sitting balance-Leahy Scale: Good     Standing balance support: During functional activity;Bilateral upper extremity supported Standing balance-Leahy Scale: Fair            Pertinent Vitals/Pain Pain Assessment: 0-10 Pain Score: 5  Pain Location: Lt hip Pain Descriptors / Indicators: Aching;Discomfort Pain Intervention(s): Limited activity within patient's tolerance;Monitored during session;Repositioned    Home Living Family/patient expects to be discharged to:: Private residence Living Arrangements: Spouse/significant other (ex-wife) Available Help at Discharge: Available PRN/intermittently Type of Home: Apartment Home Access: Level entry     Home Layout: One level Home Equipment: Bedside commode;Shower seat;Cane - single point;Grab bars - toilet;Grab bars - tub/shower;Walker - 4 wheels      Prior Function Level of Independence: Independent         Comments: pt was Independent prior to [place] in April. He has been using RW since then.     Hand Dominance   Dominant Hand: Right    Extremity/Trunk Assessment   Upper Extremity Assessment Upper  Extremity Assessment: Overall WFL for tasks assessed    Lower Extremity Assessment Lower Extremity Assessment: Overall WFL for tasks assessed    Cervical / Trunk Assessment Cervical / Trunk Assessment: Normal   Communication   Communication: No difficulties  Cognition Arousal/Alertness: Awake/alert Behavior During Therapy: WFL for tasks assessed/performed Overall Cognitive Status: Within Functional Limits for tasks assessed         General Comments      Exercises Total Joint Exercises Ankle Circles/Pumps: AROM;Both;15 reps;Seated   Assessment/Plan    PT Assessment Patient needs continued PT services  PT Problem List Decreased strength;Decreased range of motion;Decreased activity tolerance;Decreased balance;Decreased mobility;Decreased knowledge of use of DME       PT Treatment Interventions DME instruction;Gait training;Functional mobility training;Stair training;Therapeutic activities;Therapeutic exercise;Balance training;Patient/family education    PT Goals (Current goals can be found in the Care Plan section)  Acute Rehab PT Goals Patient Stated Goal: get home and back to independence PT Goal Formulation: With patient Time For Goal Achievement: 02/25/20 Potential to Achieve Goals: Good    Frequency 7X/week    AM-PAC PT "6 Clicks" Mobility  Outcome Measure Help needed turning from your back to your side while in a flat bed without using bedrails?: None Help needed moving from lying on your back to sitting on the side of a flat bed without using bedrails?: A Little Help needed moving to and from a bed to a chair (including a wheelchair)?: A Little Help needed standing up from a chair using your arms (e.g., wheelchair or bedside chair)?: A Little Help needed to walk in hospital room?: A Little Help needed climbing 3-5 steps with a railing? : A Little 6 Click Score: 19    End of Session Equipment Utilized During Treatment: Gait belt Activity Tolerance: Patient tolerated treatment well Patient left: in chair;with call bell/phone within reach;with chair alarm set Nurse Communication: Mobility status PT Visit Diagnosis: Muscle weakness (generalized) (M62.81);Difficulty in  walking, not elsewhere classified (R26.2)    Time: 3567-0141 PT Time Calculation (min) (ACUTE ONLY): 27 min   Charges:   PT Evaluation $PT Eval Low Complexity: 1 Low PT Treatments $Gait Training: 8-22 mins        Verner Mould, DPT Acute Rehabilitation Services  Office [phone] Pager [phone]  02/18/2020 7:00 PM

## 2020-02-18 NOTE — Transfer of Care (Signed)
Immediate Anesthesia Transfer of Care Note  Patient: Jimmy Conner  Procedure(s) Performed: REVISION LEFT TOTAL HIP ARTHROPLASTY, ACETABULAR COMPONENT (Left Hip)  Patient Location: PACU  Anesthesia Type:MAC and Spinal  Level of Consciousness: sedated and patient cooperative  Airway & Oxygen Therapy: Patient connected to face mask oxygen  Post-op Assessment: Report given to RN and Post -op Vital signs reviewed and stable  Post vital signs: stable  Last Vitals:  Vitals Value Taken Time  BP 105/70 02/18/20 1435  Temp    Pulse 67 02/18/20 1437  Resp 17 02/18/20 1437  SpO2 100 % 02/18/20 1437  Vitals shown include unvalidated device data.  Last Pain:  Vitals:   02/18/20 0911  TempSrc: Oral  PainSc: 0-No pain      Patients Stated Pain Goal: 3 (42/68/34 1962)  Complications: No complications documented.

## 2020-02-18 NOTE — Anesthesia Postprocedure Evaluation (Signed)
Anesthesia Post Note  Patient: Jimmy Conner  Procedure(s) Performed: REVISION LEFT TOTAL HIP ARTHROPLASTY, ACETABULAR COMPONENT (Left Hip)     Patient location during evaluation: PACU Anesthesia Type: Spinal Level of consciousness: oriented and awake and alert Pain management: pain level controlled Vital Signs Assessment: post-procedure vital signs reviewed and stable Respiratory status: spontaneous breathing, respiratory function stable and patient connected to nasal cannula oxygen Cardiovascular status: blood pressure returned to baseline and stable Postop Assessment: no headache, no backache and no apparent nausea or vomiting Anesthetic complications: no   No complications documented.  Last Vitals:  Vitals:   02/18/20 1600 02/18/20 1621  BP: (!) 145/81 122/81  Pulse: 65 70  Resp: 16 16  Temp: 36.7 C 36.4 C  SpO2: 100% 100%    Last Pain:  Vitals:   02/18/20 1600  TempSrc:   PainSc: Asleep                 Jimmy Conner

## 2020-02-18 NOTE — Plan of Care (Signed)

## 2020-02-18 NOTE — Anesthesia Procedure Notes (Signed)
Spinal  Patient location during procedure: OR Start time: 02/18/2020 11:50 AM End time: 02/18/2020 11:55 AM Staffing Performed: anesthesiologist  Anesthesiologist: Freddrick March, MD Preanesthetic Checklist Completed: patient identified, IV checked, risks and benefits discussed, surgical consent, monitors and equipment checked, pre-op evaluation and timeout performed Spinal Block Patient position: sitting Prep: DuraPrep and site prepped and draped Patient monitoring: cardiac monitor, continuous pulse ox and blood pressure Approach: midline Location: L3-4 Injection technique: single-shot Needle Needle type: Pencan  Needle gauge: 24 G Needle length: 9 cm Assessment Sensory level: T6 Additional Notes Functioning IV was confirmed and monitors were applied. Sterile prep and drape, including hand hygiene and sterile gloves were used. The patient was positioned and the spine was prepped. The skin was anesthetized with lidocaine.  Free flow of clear CSF was obtained prior to injecting local anesthetic into the CSF.  The spinal needle aspirated freely following injection.  The needle was carefully withdrawn.  The patient tolerated the procedure well.

## 2020-02-19 LAB — CBC
HCT: 28.8 % — ABNORMAL LOW (ref 39.0–52.0)
Hemoglobin: 9.3 g/dL — ABNORMAL LOW (ref 13.0–17.0)
MCH: 29.4 pg (ref 26.0–34.0)
MCHC: 32.3 g/dL (ref 30.0–36.0)
MCV: 91.1 fL (ref 80.0–100.0)
Platelets: 194 10*3/uL (ref 150–400)
RBC: 3.16 MIL/uL — ABNORMAL LOW (ref 4.22–5.81)
RDW: 13.2 % (ref 11.5–15.5)
WBC: 8.6 10*3/uL (ref 4.0–10.5)
nRBC: 0 % (ref 0.0–0.2)

## 2020-02-19 LAB — BASIC METABOLIC PANEL
Anion gap: 6 (ref 5–15)
BUN: 19 mg/dL (ref 8–23)
CO2: 28 mmol/L (ref 22–32)
Calcium: 8.3 mg/dL — ABNORMAL LOW (ref 8.9–10.3)
Chloride: 103 mmol/L (ref 98–111)
Creatinine, Ser: 1.11 mg/dL (ref 0.61–1.24)
GFR calc Af Amer: >60 mL/min (ref >60)
GFR calc non Af Amer: >60 mL/min (ref >60)
Glucose, Bld: 152 mg/dL — ABNORMAL HIGH (ref 70–99)
Potassium: 4.3 mmol/L (ref 3.5–5.1)
Sodium: 137 mmol/L (ref 135–145)

## 2020-02-19 MED ORDER — ASPIRIN 81 MG PO CHEW: 81.0000 mg | CHEWABLE_TABLET | Freq: Two times a day (BID) | ORAL | 0 refills | Status: AC

## 2020-02-19 MED ORDER — METHOCARBAMOL 500 MG PO TABS: 500.0000 mg | ORAL_TABLET | Freq: Four times a day (QID) | ORAL | 0 refills | Status: DC | PRN

## 2020-02-19 MED ORDER — DOCUSATE SODIUM 100 MG PO CAPS
100.0000 mg | ORAL_CAPSULE | Freq: Two times a day (BID) | ORAL | 0 refills | Status: DC
Start: 2020-02-19 — End: 2022-09-19

## 2020-02-19 MED ORDER — TRAMADOL HCL 50 MG PO TABS: 50.0000 mg | ORAL_TABLET | Freq: Four times a day (QID) | ORAL | 0 refills | Status: DC | PRN

## 2020-02-19 MED ORDER — POLYETHYLENE GLYCOL 3350 17 G PO PACK
17.0000 g | PACK | Freq: Two times a day (BID) | ORAL | 0 refills | Status: DC
Start: 2020-02-19 — End: 2022-08-17

## 2020-02-19 MED ORDER — FERROUS SULFATE 325 (65 FE) MG PO TABS: 325.0000 mg | ORAL_TABLET | Freq: Three times a day (TID) | ORAL | 0 refills | Status: DC

## 2020-02-19 NOTE — Progress Notes (Signed)
Physical Therapy Treatment Patient Details Name: Jimmy Conner MRN: [id number] DOB: [birth year] Today's Date: 02/19/2020    History of Present Illness Patient is 74 y.o. male s/p Lt THR (acetabular component revision) on 02/18/20 with PMH significant for prostate cancer, HTN, HIV, OA, anemia, colon cancer, initial Lt THA on 12/15/19.    PT Comments    Patient progressing well with acute therapy and completed bed mobility and sit<>stand transfers at min guard level. Pt was able to recall WB precautions for Lt LE at start of session without cues. He advanced gait distance today with min guard/assist. Initially pt with good use of UE's to maintain precautions but decreased ability as he fatigued. Patient was instructed on importance of standing rest breaks to allow UE's to recover to better maintain restrictions while mobilizing. Reviewed exercises for HEP with patient; pt with greatest difficulty with side kick due to pain. He will continue to benefit from skilled PT interventions to progress mobility as able. He does not have a RW and has not received on from the [place] but would benefit from this. Pt has a rollator at home and reports it has not been adjusted to fit him. Requested he have family/friend bring it in for adjustment and recommend front wheeled walker as best choice for mobility if able to obtain.    Follow Up Recommendations  Follow surgeon's recommendation for DC plan and follow-up therapies     Equipment Recommendations  Rolling walker with 5" wheels    Recommendations for Other Services       Precautions / Restrictions Precautions Precautions: Fall Restrictions Weight Bearing Restrictions: Yes LLE Weight Bearing: Touchdown weight bearing    Mobility  Bed Mobility Overal bed mobility: Needs Assistance Bed Mobility: Supine to Sit     Supine to sit: Min guard     General bed mobility comments: pt required extra time and effort to rasie trunk from flat bed.    Transfers Overall transfer level: Needs assistance Equipment used: Rolling walker (2 wheeled) Transfers: Sit to/from Stand Sit to Stand: Min guard         General transfer comment: VC's for hand placement and technique with RW, guarding for safety, no assist required.   Ambulation/Gait Ambulation/Gait assistance: Min assist;Min guard Gait Distance (Feet): 220 Feet Assistive device: Rolling walker (2 wheeled) Gait Pattern/deviations: Step-to pattern;Decreased stance time - left;Decreased weight shift to left;Decreased stride length Gait velocity: fair   General Gait Details: VC's to maintain TDWB precautions and to maintain safe proximity to RW. patient initially with good use of triceps to press up and maintain precautions but more limited as fatigued. He required standing rest break and VC's to rest if UE's fatigued.    Stairs        Wheelchair Mobility    Modified Rankin (Stroke Patients Only)       Balance Overall balance assessment: Needs assistance Sitting-balance support: Feet supported Sitting balance-Leahy Scale: Good     Standing balance support: During functional activity;Bilateral upper extremity supported Standing balance-Leahy Scale: Fair           Cognition Arousal/Alertness: Awake/alert Behavior During Therapy: WFL for tasks assessed/performed Overall Cognitive Status: Within Functional Limits for tasks assessed              Exercises Total Joint Exercises Ankle Circles/Pumps: AROM;Both;15 reps;Seated Short Arc Quad: AROM;Left;5 reps;Seated Hip ABduction/ADduction: AROM;Left;5 reps;Seated Long Arc Quad: AROM;Left;5 reps;Seated    General Comments        Pertinent Vitals/Pain Pain Assessment:  Faces Faces Pain Scale: Hurts a little bit Pain Location: Lt hip Pain Descriptors / Indicators: Aching;Discomfort Pain Intervention(s): Limited activity within patient's tolerance;Monitored during session;Repositioned           PT  Goals (current goals can now be found in the care plan section) Acute Rehab PT Goals Patient Stated Goal: get home and back to independence PT Goal Formulation: With patient Time For Goal Achievement: 02/25/20 Potential to Achieve Goals: Good Progress towards PT goals: Progressing toward goals    Frequency    7X/week      PT Plan Current plan remains appropriate       AM-PAC PT "6 Clicks" Mobility   Outcome Measure  Help needed turning from your back to your side while in a flat bed without using bedrails?: None Help needed moving from lying on your back to sitting on the side of a flat bed without using bedrails?: A Little Help needed moving to and from a bed to a chair (including a wheelchair)?: A Little Help needed standing up from a chair using your arms (e.g., wheelchair or bedside chair)?: A Little Help needed to walk in hospital room?: A Little Help needed climbing 3-5 steps with a railing? : A Little 6 Click Score: 19    End of Session Equipment Utilized During Treatment: Gait belt Activity Tolerance: Patient tolerated treatment well Patient left: in chair;with call bell/phone within reach;with chair alarm set Nurse Communication: Mobility status PT Visit Diagnosis: Muscle weakness (generalized) (M62.81);Difficulty in walking, not elsewhere classified (R26.2)     Time: 5726-2035 PT Time Calculation (min) (ACUTE ONLY): 29 min  Charges:  $Gait Training: 8-22 mins $Therapeutic Exercise: 8-22 mins                     Verner Mould, DPT Acute Rehabilitation Services  Office [phone] Pager [phone]  02/19/2020 11:20 AM

## 2020-02-19 NOTE — TOC Initial Note (Signed)
Transition of Care [facility]) - Initial/Assessment Note    Patient Details  Name: Jimmy Conner MRN: [id number] Date of Birth: 12/06/1945  Transition of Care [facility]) CM/SW Contact:    Lia Hopping, [place] Phone Number: 02/19/2020, 4:14 PM  Clinical Narrative:       Patient admitted for revision left total hip arthroplasty,acetabular component (surgical intervention).            CSW met with the patient at bedside to discuss SNF placement for rehab. Patient is active with [facility]. Patient understands he will need approval from the [place] may have to go to a [place] affiliated SNF. Patient report understanding CSW completed application/physician signed.  Application w/ requested clinicals faxed to: [phone]. CSW will notify the medical team once review is completed and SNF bed is available.  TOC staff will continue to follow this patient.    Expected Discharge Plan: Skilled Nursing Facility Barriers to Discharge: No Barriers Identified   Patient Goals and CMS Choice        Expected Discharge Plan and Services Expected Discharge Plan: [place]   Discharge Planning Services: CM Consult Post Acute Care Choice: [place] Living arrangements for the past 2 months: Single Family Home Expected Discharge Date: 02/19/20               DME Arranged: N/A DME Agency: NA       HH Arranged: NA [place] Agency: NA        Prior Living Arrangements/Services Living arrangements for the past 2 months: Single Family Home Lives with:: Self Patient language and need for interpreter reviewed:: No Do you feel safe going back to the place where you live?: Yes      Need for Family Participation in Patient Care: Yes (Comment) Care giver support system in place?: Yes (comment) Current home services: DME Criminal Activity/Legal Involvement Pertinent to Current Situation/Hospitalization: No - Comment as needed  Activities of Daily Living Home Assistive  Devices/Equipment: Environmental consultant (specify type), Shower chair without back, Eyeglasses ADL Screening (condition at time of admission) Patient's cognitive ability adequate to safely complete daily activities?: Yes Is the patient deaf or have difficulty hearing?: No Does the patient have difficulty seeing, even when wearing glasses/contacts?: No Does the patient have difficulty concentrating, remembering, or making decisions?: No Patient able to express need for assistance with ADLs?: Yes Does the patient have difficulty dressing or bathing?: No Independently performs ADLs?: Yes (appropriate for developmental age) Does the patient have difficulty walking or climbing stairs?: No Weakness of Legs: Both Weakness of Arms/Hands: None  Permission Sought/Granted   Permission granted to share information with : Yes, Verbal Permission Granted  Share Information with NAME: MARLOR,NADINE  Permission granted to share info w AGENCY: [employer] granted to share info w Relationship: Spouse  Permission granted to share info w Contact Information: [phone]  [phone]  Emotional Assessment Appearance:: Appears stated age Attitude/Demeanor/Rapport: Engaged Affect (typically observed): Accepting Orientation: : Oriented to Self, Oriented to Place, Oriented to  Time, Oriented to Situation Alcohol / Substance Use: Not Applicable Psych Involvement: No (comment)  Admission diagnosis:  S/P revision of total hip [Z96.649] Patient Active Problem List   Diagnosis Date Noted  . 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 Family history of colon cancer   . Family history of lung cancer   . Malignant neoplasm of prostate ([place]) 10/15/2018  . UTI (urinary tract infection) 03/03/2018  . Status  post placement of ureteral stent 03/03/2018  . HIV (human immunodeficiency virus infection) ([place]) 03/03/2018  . HTN (hypertension) 03/03/2018  . AKI (acute kidney injury)  ([place]) 03/03/2018  . Personal history of other malignant neoplasm of skin 08/05/2014   PCP:  Clinic, [place]:   [place], [place] [address] Phone: [phone] Fax: [phone]     Social Determinants of Health (SDOH) Interventions    Readmission Risk Interventions No flowsheet data found.

## 2020-02-19 NOTE — Op Note (Signed)
NAME: KHYREN, HING MEDICAL RECORD [id number] ACCOUNT [id number] DATE OF BIRTH:1946/06/18 FACILITY: [facility] LOCATION: [facility] PHYSICIAN:Theola Cuellar Marian Sorrow, MD  OPERATIVE REPORT  DATE OF PROCEDURE:  02/18/2020  PREOPERATIVE DIAGNOSIS:  Failed left hip replacement, acetabular component.  POSTOPERATIVE DIAGNOSIS:  Failed left hip replacement, acetabular component.  FINDINGS:  Please see operative note for findings as well as preoperative indication.  PROCEDURE:  Revision left total hip arthroplasty, acetabular component, acetabular liner, femoral head.  SURGEON:  Paralee Cancel, MD  ASSISTANT:  Danae Orleans, PA-C.  Note that M4. Babish was present for the entirety of the case from preoperative positioning, perioperative management of the operative extremity, general facilitation of the case, and primary wound closure.  ANESTHESIA:  Spinal.  SPECIMENS:  None.  ESTIMATED BLOOD LOSS:  About 400 mL.  COMPLICATIONS:  None.  DRAINS:  None.  INDICATIONS:  The patient is a very pleasant 74 year old male who had presented to the office with progressive left hip pain associated with a femoral neck fracture that he had been ambulating on for a period of time.  He also had medical comorbidities.   His index procedure was performed approximately 8 weeks ago.  He had no significant complaints.  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  Best that I can recall there were no significant concerns at the time of his index procedure or any penetration of the medial wall.  Nonetheless, this presentation necessitated revision surgery.  I discussed this with him,  discussed the planned surgical procedure, which would be to try to provide more of a rim fit and allow for stabilization with backfilling of bone graft and hopefully, incorporation of bone graft into this  medial defect.  Consent was obtained for benefit  of pain relief and functionality.  Postoperative course was reviewed including limited weightbearing for a while to get this to heal.  Consent was obtained.  Standard risk of infection, DVT, dislocation were all discussed and reviewed.  PROCEDURE IN DETAIL:  The patient was brought to the operative theater.  Once adequate anesthesia, preoperative antibiotics, Ancef administered as well as tranexamic acid and Decadron, he was positioned supine on the Hana table.  All bony prominences  were carefully padded and protected.  The left hip was then predraped.  Fluoroscopy was used to orient the pelvis.  The left hip was then prepped and draped in sterile fashion.  Timeout was performed identifying the patient, the planned procedure and  extremity.  His old incision was utilized and excised and extended slightly proximal and distal for exposure purposes.  The fascia of the tensor fascia was identified and noted to be scarred.  This was incised.  I did my best at this point to sweep the  scarred tensor from the fascia itself.  I was able to get this exposed and retractors placed to sweep it laterally.  Then a portion of the case, at this point, was exposure, excising scar tissue from the submuscular level down to the joint.  We  encountered clear synovial fluid.  No signs of infection or inflammation.  Once I had the anterior aspect of the hip exposed, I first applied manual traction and then fine traction and then was able to use a bone tamp to remove the femoral head.  I was  then able to externally rotate the hip to 100 degrees or thereabouts and  take traction off.  At this point, I was able to remove his femoral head.  This allowed for further exposure around the acetabulum, which was definitely protrused based on the  anatomic landmarks that were palpable as well as confirmed radiographically.  I then used a drill and a 6.5 mm cancellous screw to remove the  acetabular liner.  I then used the screwdriver to remove the cancellous screw and the hole eliminator.  I then  attached to the acetabular component the insertion device and was able to back the component out by rotation inferiorly to get it out from underneath the iliac rim.  Clinically, both by palpation as well as radiographically, I was able to identify the  superior ilium, the posterior wall and column, and the anterior wall as being intact.  Given this, and the fact that we had removed a 54 mm cup, I put the 54 mm reamer in and gently reamed the area.  I then placed a 55 mm reamer and under fluoroscopic  imaging, identified the rim fit that we wanted and based on this, I selected a 56 mm Pinnacle multihole Gription cup.  This was then carefully impacted on to the rim of the acetabulum.  The orientation was confirmed radiographically.  Once I had this in  position I placed 3 cancellous screws into the ilium with good purchase.  I then placed a trial liner.  We did a series of reduction attempts and I felt that the best reduction was with a +12 ball to provide stability of the hip as well as to try to  match his contralateral hip.  Given these findings, the trial components were removed.  The final 36+4 neutral Ultrex liner to match the 56 mm shell was then impacted.  Based on the trial reductions we selected the 36+12 delta ceramic ball, which was  impacted on the clean and dried trunnion and the hip was reduced.  The hip was irrigated throughout the case with pulse lavage irrigation for a total of a liter.  Due to the scar tissue present there was no capsule to repair.  The scarred fascia over the  tensor fascia was reapproximated using a combination of 1 Vicryl and running Stratafix suture.  The remainder of the wound was closed in layers with 2-0 Vicryl and a running 3-0 Monocryl.  The hip wound was then cleaned, dried and dressed sterilely  using surgical glue and Aquacel dressing.  He was then  brought to the recovery room in stable condition, tolerating the procedure well.  Findings were reviewed with his significant other.  Postoperatively, we will have him be touchdown weightbearing with limited weightbearing for 6-8 weeks to allow for bony ingrowth.  His disposition may require nursing facility for care needs postoperatively.  Otherwise, regular pain medication routine.   I may place him on antibiotics based on the proximity to his index surgery in an effort to try to prevent infection.  CN/NUANCE  D:02/19/2020 T:02/19/2020 JOB:[id number]

## 2020-02-19 NOTE — Progress Notes (Signed)
Patient ID: Jimmy Conner, male   DOB: 1945-10-04, 74 y.o.   MRN: [id number] Subjective: 1 Day Post-Op Procedure(s) (LRB): REVISION LEFT TOTAL HIP ARTHROPLASTY, ACETABULAR COMPONENT (Left)    Patient reports pain as mild.  Feels better than what he remembers after index left THR No events or problems reported  Objective:   VITALS:   Vitals:   02/19/20 0151 02/19/20 0530  BP: 127/81 (!) 153/76  Pulse: 83 62  Resp: 15 16  Temp: 98 F (36.7 C) 98.6 F (37 C)  SpO2: 100% 99%    Neurovascular intact Incision: dressing C/D/I  LABS Recent Labs    02/16/20 1326 02/19/20 0327  HGB 12.1* 9.3*  HCT 37.8* 28.8*  WBC 6.2 8.6  PLT 227 194    Recent Labs    02/16/20 1326 02/19/20 0327  NA 140 137  K 4.2 4.3  BUN 18 19  CREATININE 1.17 1.11  GLUCOSE 102* 152*    No results for input(s): LABPT, INR in the last 72 hours.   Assessment/Plan: 1 Day Post-Op Procedure(s) (LRB): REVISION LEFT TOTAL HIP ARTHROPLASTY, ACETABULAR COMPONENT (Left)   Advance diet Up with therapy - TDWB LLE with walker Disposition pending but pre-op plans were to transition to STSNF due to weight bearing restrictions TOC consult placed

## 2020-02-19 NOTE — Progress Notes (Signed)
Physical Therapy Treatment Patient Details Name: Jimmy Conner MRN: [id number] DOB: [birth year] Today's Date: 02/19/2020    History of Present Illness Patient is 74 y.o. male s/p Lt THR (acetabular component revision) on 02/18/20 with PMH significant for prostate cancer, HTN, HIV, OA, anemia, colon cancer, initial Lt THA on 12/15/19.    PT Comments    Patient continues to make steady progress with PT. He continues to fatigue with gait and requires increased cues to maintain TDWB on Lt LE and cue to rest. Patient instructed on safe stair mobility this date to ascend single curb with UE's. He was able to maintain WB precautions with reverse step up technique but was not successful with forward step up. Reviewed supine exercises for HEP. Patient will continue to benefit from skilled PT interventions to address impairments and progress mobility as able.   Follow Up Recommendations  Follow surgeon's recommendation for DC plan and follow-up therapies     Equipment Recommendations  Rolling walker with 5" wheels    Recommendations for Other Services       Precautions / Restrictions Precautions Precautions: Fall Restrictions Weight Bearing Restrictions: Yes LLE Weight Bearing: Touchdown weight bearing    Mobility  Bed Mobility Overal bed mobility: Needs Assistance Bed Mobility: Sit to Supine       Sit to supine: Min guard   General bed mobility comments: cues to use belt to raise Lt LE into bed, no assist required. extra time and effort.   Transfers Overall transfer level: Needs assistance Equipment used: Rolling walker (2 wheeled) Transfers: Sit to/from Stand Sit to Stand: Min guard         General transfer comment: VC's for hand placement and technique with RW, guarding for safety, no assist required.   Ambulation/Gait Ambulation/Gait assistance: Min assist;Min guard Gait Distance (Feet): 60 Feet Assistive device: Rolling walker (2 wheeled) Gait Pattern/deviations:  Step-to pattern;Decreased stance time - left;Decreased weight shift to left;Decreased stride length Gait velocity: fair   General Gait Details: pt wtih good recall for TDWB and good ability to maintain at start of gait. As pt fatigued he requried increased cues to maintain TDWB. no overt LOB.   Stairs Stairs: Yes Stairs assistance: Min guard Stair Management: No rails;Forwards;Backwards;Step to pattern;With walker Number of Stairs: 2 General stair comments: 2x1 step for curb negotiation training. pt performed 1x with forwrad technique but not able to maintain TDWB and PT recommend reverse step up technique. Pt able to complete with cue for sequencing and no overt LOB noted.    Wheelchair Mobility    Modified Rankin (Stroke Patients Only)       Balance Overall balance assessment: Needs assistance Sitting-balance support: Feet supported Sitting balance-Leahy Scale: Good     Standing balance support: During functional activity;Bilateral upper extremity supported Standing balance-Leahy Scale: Fair           Cognition Arousal/Alertness: Awake/alert Behavior During Therapy: WFL for tasks assessed/performed Overall Cognitive Status: Within Functional Limits for tasks assessed         Exercises Total Joint Exercises Ankle Circles/Pumps: AROM;Both;15 reps;Seated Quad Sets: AROM;Left;10 reps;Supine Heel Slides: AROM;Left;10 reps;Supine    General Comments        Pertinent Vitals/Pain Pain Assessment: 0-10 Pain Score: 4  Pain Location: Lt hip Pain Descriptors / Indicators: Aching;Discomfort Pain Intervention(s): Limited activity within patient's tolerance;Monitored during session;Repositioned;Ice applied           PT Goals (current goals can now be found in the care plan section) Acute Rehab  PT Goals Patient Stated Goal: get home and back to independence PT Goal Formulation: With patient Time For Goal Achievement: 02/25/20 Potential to Achieve Goals:  Good Progress towards PT goals: Progressing toward goals    Frequency    7X/week      PT Plan Current plan remains appropriate       AM-PAC PT "6 Clicks" Mobility   Outcome Measure  Help needed turning from your back to your side while in a flat bed without using bedrails?: None Help needed moving from lying on your back to sitting on the side of a flat bed without using bedrails?: A Little Help needed moving to and from a bed to a chair (including a wheelchair)?: A Little Help needed standing up from a chair using your arms (e.g., wheelchair or bedside chair)?: A Little Help needed to walk in hospital room?: A Little Help needed climbing 3-5 steps with a railing? : A Little 6 Click Score: 19    End of Session Equipment Utilized During Treatment: Gait belt Activity Tolerance: Patient tolerated treatment well Patient left: in chair;with call bell/phone within reach;with chair alarm set Nurse Communication: Mobility status PT Visit Diagnosis: Muscle weakness (generalized) (M62.81);Difficulty in walking, not elsewhere classified (R26.2)     Time: 1660-6004 PT Time Calculation (min) (ACUTE ONLY): 21 min  Charges:  $Gait Training: 8-22 mins                     Verner Mould, DPT Acute Rehabilitation Services  Office [phone] Pager [phone]  02/19/2020 3:47 PM

## 2020-02-19 NOTE — Plan of Care (Signed)
Plan of care reviewed and discussed with the patient. 

## 2020-02-20 LAB — CBC
HCT: 27.8 % — ABNORMAL LOW (ref 39.0–52.0)
Hemoglobin: 9 g/dL — ABNORMAL LOW (ref 13.0–17.0)
MCH: 30.1 pg (ref 26.0–34.0)
MCHC: 32.4 g/dL (ref 30.0–36.0)
MCV: 93 fL (ref 80.0–100.0)
Platelets: 214 10*3/uL (ref 150–400)
RBC: 2.99 MIL/uL — ABNORMAL LOW (ref 4.22–5.81)
RDW: 13.3 % (ref 11.5–15.5)
WBC: 11.6 10*3/uL — ABNORMAL HIGH (ref 4.0–10.5)
nRBC: 0 % (ref 0.0–0.2)

## 2020-02-20 LAB — BASIC METABOLIC PANEL
Anion gap: 8 (ref 5–15)
BUN: 21 mg/dL (ref 8–23)
CO2: 26 mmol/L (ref 22–32)
Calcium: 8.4 mg/dL — ABNORMAL LOW (ref 8.9–10.3)
Chloride: 101 mmol/L (ref 98–111)
Creatinine, Ser: 1.02 mg/dL (ref 0.61–1.24)
GFR calc Af Amer: >60 mL/min (ref >60)
GFR calc non Af Amer: >60 mL/min (ref >60)
Glucose, Bld: 118 mg/dL — ABNORMAL HIGH (ref 70–99)
Potassium: 4.2 mmol/L (ref 3.5–5.1)
Sodium: 135 mmol/L (ref 135–145)

## 2020-02-20 NOTE — Progress Notes (Signed)
Subjective: 2 Days Post-Op Procedure(s) (LRB): REVISION LEFT TOTAL HIP ARTHROPLASTY, ACETABULAR COMPONENT (Left)  Patient reports pain as mild to moderate.  Resting comfortably in bed.  Tolerating POs well.  Admits to flatus.  Reports that he has worked well with therapy.  Objective:   VITALS:  Temp:  [97.5 F (36.4 C)-98.1 F (36.7 C)] 97.5 F (36.4 C) (06/19 0542) Pulse Rate:  [61-77] 61 (06/19 0542) Resp:  [16-20] 16 (06/19 0542) BP: (114-169)/(77-105) 169/77 (06/19 0542) SpO2:  [97 %-99 %] 99 % (06/19 0542)  General: WDWN patient in NAD. Psych:  Appropriate mood and affect. Neuro:  A&O x 3, Moving all extremities, sensation intact to light touch HEENT:  EOMs intact Chest:  Even non-labored respirations Skin:  Incision C/D/I, no rashes or lesions Extremities: warm/dry, mild edema to L hip, no erythema or echymosis.  No lymphadenopathy. Pulses: Popliteus 2+ MSK:  ROM: TKE, MMT: able to perform quad set, (-) Homan's    LABS Recent Labs    02/19/20 0327 02/20/20 0316  HGB 9.3* 9.0*  WBC 8.6 11.6*  PLT 194 214   Recent Labs    02/19/20 0327 02/20/20 0316  NA 137 135  K 4.3 4.2  CL 103 101  CO2 28 26  BUN 19 21  CREATININE 1.11 1.02  GLUCOSE 152* 118*   No results for input(s): LABPT, INR in the last 72 hours.   Assessment/Plan: 2 Days Post-Op Procedure(s) (LRB): REVISION LEFT TOTAL HIP ARTHROPLASTY, ACETABULAR COMPONENT (Left)  Patient seen in rounds for Dr.Olin TDWB L LE with walker Up with therapy Disp: SNF when bed available Plan for outpatient post-op visit with Dr. Vick Frees PA-C [facility] Office:  [phone]

## 2020-02-20 NOTE — Progress Notes (Signed)
Physical Therapy Treatment Patient Details Name: Jimmy Conner MRN: [id number] DOB: [birth year] Today's Date: 02/20/2020    History of Present Illness Patient is 74 y.o. male s/p Lt THR (acetabular component revision) on 02/18/20 with PMH significant for prostate cancer, HTN, HIV, OA, anemia, colon cancer, initial Lt THA on 12/15/19.    PT Comments     Pt very motivated and progressing steadily with mobility but with repeated cues to slow speed for safety and to insure follow through with TDWB.  Follow Up Recommendations  Follow surgeon's recommendation for DC plan and follow-up therapies     Equipment Recommendations  Rolling walker with 5" wheels    Recommendations for Other Services       Precautions / Restrictions Precautions Precautions: Fall Restrictions LLE Weight Bearing: Touchdown weight bearing    Mobility  Bed Mobility Overal bed mobility: Needs Assistance Bed Mobility: Supine to Sit;Sit to Supine     Supine to sit: Min guard Sit to supine: Min guard   General bed mobility comments: increased time with cues for use of belt to self assist  Transfers Overall transfer level: Needs assistance Equipment used: Rolling walker (2 wheeled) Transfers: Sit to/from Stand Sit to Stand: Min guard         General transfer comment: VC's for hand placement and technique with RW, guarding for safety, no assist required.   Ambulation/Gait Ambulation/Gait assistance: Min guard Gait Distance (Feet): 95 Feet Assistive device: Rolling walker (2 wheeled) Gait Pattern/deviations: Step-to pattern;Decreased stance time - left;Decreased weight shift to left;Decreased stride length Gait velocity: fair   General Gait Details: Min cues for posture, position from RW, to slow pace for safety and for follow through with TDWB   Stairs             Wheelchair Mobility    Modified Rankin (Stroke Patients Only)       Balance Overall balance assessment: Needs  assistance Sitting-balance support: Feet supported Sitting balance-Leahy Scale: Good     Standing balance support: During functional activity;Bilateral upper extremity supported Standing balance-Leahy Scale: Fair                              Cognition Arousal/Alertness: Awake/alert Behavior During Therapy: WFL for tasks assessed/performed;Impulsive Overall Cognitive Status: Within Functional Limits for tasks assessed                                        Exercises      General Comments        Pertinent Vitals/Pain Pain Assessment: 0-10 Pain Score: 3  Pain Location: Lt hip Pain Descriptors / Indicators: Aching;Discomfort Pain Intervention(s): Premedicated before session;Monitored during session;Limited activity within patient's tolerance    Home Living                      Prior Function            PT Goals (current goals can now be found in the care plan section) Acute Rehab PT Goals Patient Stated Goal: get home and back to independence PT Goal Formulation: With patient Time For Goal Achievement: 02/25/20 Potential to Achieve Goals: Good Progress towards PT goals: Progressing toward goals    Frequency    7X/week      PT Plan Current plan remains appropriate    Co-evaluation  AM-PAC PT "6 Clicks" Mobility   Outcome Measure  Help needed turning from your back to your side while in a flat bed without using bedrails?: None Help needed moving from lying on your back to sitting on the side of a flat bed without using bedrails?: A Little Help needed moving to and from a bed to a chair (including a wheelchair)?: A Little Help needed standing up from a chair using your arms (e.g., wheelchair or bedside chair)?: A Little Help needed to walk in hospital room?: A Little Help needed climbing 3-5 steps with a railing? : A Little 6 Click Score: 19    End of Session Equipment Utilized During Treatment: Gait  belt Activity Tolerance: Patient tolerated treatment well Patient left: with call bell/phone within reach;in bed;with bed alarm set Nurse Communication: Mobility status PT Visit Diagnosis: Muscle weakness (generalized) (M62.81);Difficulty in walking, not elsewhere classified (R26.2)     Time: 1540-1600 PT Time Calculation (min) (ACUTE ONLY): 20 min  Charges:  $Gait Training: 8-22 mins                     [place] Pager [phone] Office [phone]    Shireen Rayburn 02/20/2020, 4:22 PM

## 2020-02-20 NOTE — Progress Notes (Signed)
Physical Therapy Treatment Patient Details Name: Jimmy Conner MRN: [id number] DOB: 1946/03/17 Today's Date: 02/20/2020    History of Present Illness Patient is 74 y.o. male s/p Lt THR (acetabular component revision) on 02/18/20 with PMH significant for prostate cancer, HTN, HIV, OA, anemia, colon cancer, initial Lt THA on 12/15/19.    PT Comments    Pt very motivated and progressing steadily with mobility but with repeated cues to slow speed for safety.   Follow Up Recommendations  Follow surgeon's recommendation for DC plan and follow-up therapies     Equipment Recommendations  Rolling walker with 5" wheels    Recommendations for Other Services       Precautions / Restrictions Precautions Precautions: Fall Restrictions LLE Weight Bearing: Touchdown weight bearing    Mobility  Bed Mobility Overal bed mobility: Needs Assistance Bed Mobility: Supine to Sit     Supine to sit: Min guard     General bed mobility comments: cues to use belt to raise Lt LE into bed, no assist required. extra time and effort.   Transfers Overall transfer level: Needs assistance Equipment used: Rolling walker (2 wheeled) Transfers: Sit to/from Stand Sit to Stand: Min guard         General transfer comment: VC's for hand placement and technique with RW, guarding for safety, no assist required.   Ambulation/Gait Ambulation/Gait assistance: Min assist;Min guard Gait Distance (Feet): 95 Feet Assistive device: Rolling walker (2 wheeled) Gait Pattern/deviations: Step-to pattern;Decreased stance time - left;Decreased weight shift to left;Decreased stride length Gait velocity: fair   General Gait Details: pt wtih good recall for TDWB and good ability to maintain at start of gait. As pt fatigued he requried increased cues to maintain TDWB. no overt LOB.   Stairs             Wheelchair Mobility    Modified Rankin (Stroke Patients Only)       Balance Overall balance assessment:  Needs assistance Sitting-balance support: Feet supported Sitting balance-Leahy Scale: Good     Standing balance support: During functional activity;Bilateral upper extremity supported Standing balance-Leahy Scale: Fair                              Cognition Arousal/Alertness: Awake/alert Behavior During Therapy: WFL for tasks assessed/performed;Impulsive Overall Cognitive Status: Within Functional Limits for tasks assessed                                        Exercises Total Joint Exercises Ankle Circles/Pumps: AROM;Both;15 reps;Seated Quad Sets: AROM;Left;10 reps;Supine Heel Slides: AROM;Left;Supine;20 reps Hip ABduction/ADduction: AROM;Left;15 reps;Supine    General Comments        Pertinent Vitals/Pain Pain Assessment: 0-10 Pain Score: 4  Pain Location: Lt hip Pain Descriptors / Indicators: Aching;Discomfort Pain Intervention(s): Limited activity within patient's tolerance;Monitored during session;Premedicated before session    Home Living                      Prior Function            PT Goals (current goals can now be found in the care plan section) Acute Rehab PT Goals Patient Stated Goal: get home and back to independence PT Goal Formulation: With patient Time For Goal Achievement: 02/25/20 Potential to Achieve Goals: Good Progress towards PT goals: Progressing toward goals  Frequency    7X/week      PT Plan Current plan remains appropriate    Co-evaluation              AM-PAC PT "6 Clicks" Mobility   Outcome Measure  Help needed turning from your back to your side while in a flat bed without using bedrails?: None Help needed moving from lying on your back to sitting on the side of a flat bed without using bedrails?: A Little Help needed moving to and from a bed to a chair (including a wheelchair)?: A Little Help needed standing up from a chair using your arms (e.g., wheelchair or bedside chair)?:  A Little Help needed to walk in hospital room?: A Little Help needed climbing 3-5 steps with a railing? : A Little 6 Click Score: 19    End of Session Equipment Utilized During Treatment: Gait belt Activity Tolerance: Patient tolerated treatment well Patient left: in chair;with call bell/phone within reach;with chair alarm set Nurse Communication: Mobility status PT Visit Diagnosis: Muscle weakness (generalized) (M62.81);Difficulty in walking, not elsewhere classified (R26.2)     Time: 0934-1000 PT Time Calculation (min) (ACUTE ONLY): 26 min  Charges:  $Gait Training: 8-22 mins $Therapeutic Exercise: 8-22 mins                     [place] Pager [phone] Office [phone]    Timotheus Salm 02/20/2020, 11:16 AM

## 2020-02-21 NOTE — Progress Notes (Signed)
Jimmy Conner  MRN: [id number] DOB/Age: [birth year] 72 y.o. [facility] Procedure: Procedure(s) (LRB): REVISION LEFT TOTAL HIP ARTHROPLASTY, ACETABULAR COMPONENT (Left)     Subjective: Awake and alert, no specific complaints  Vital Signs Temp:  [97.9 F (36.6 C)-98.1 F (36.7 C)] 98.1 F (36.7 C) (06/20 0531) Pulse Rate:  [72-78] 78 (06/20 0531) Resp:  [16-20] 16 (06/20 0531) BP: (135-153)/(80-90) 153/83 (06/20 0531) SpO2:  [98 %-100 %] 99 % (06/20 0531)  Lab Results Recent Labs    02/19/20 0327 02/20/20 0316  WBC 8.6 11.6*  HGB 9.3* 9.0*  HCT 28.8* 27.8*  PLT 194 214   BMET Recent Labs    02/19/20 0327 02/20/20 0316  NA 137 135  K 4.3 4.2  CL 103 101  CO2 28 26  GLUCOSE 152* 118*  BUN 19 21  CREATININE 1.11 1.02  CALCIUM 8.3* 8.4*   INR  Date Value Ref Range Status  12/03/2015 1.10 0.0 - 1.4 Final     Exam Left hip dressing dry and clean NVI        Plan Continue PT/OT and disposition planning SNF when bed available  Jenetta Loges PA-C  02/21/2020, 9:24 AM Contact # [phone]

## 2020-02-21 NOTE — Progress Notes (Signed)
Physical Therapy Treatment Patient Details Name: Jimmy Conner MRN: [id number] DOB: [birth year] Today's Date: 02/21/2020    History of Present Illness Patient is 74 y.o. male s/p Lt THR (acetabular component revision) on 02/18/20 with PMH significant for prostate cancer, HTN, HIV, OA, anemia, colon cancer, initial Lt THA on 12/15/19.    PT Comments     Pt very motivated and progressing steadily with mobility but with repeated cues to slow speed for safety and to insure follow through with TDWB  Follow Up Recommendations  Follow surgeon's recommendation for DC plan and follow-up therapies     Equipment Recommendations  Rolling walker with 5" wheels    Recommendations for Other Services       Precautions / Restrictions Precautions Precautions: Fall Restrictions LLE Weight Bearing: Touchdown weight bearing    Mobility  Bed Mobility Overal bed mobility: Needs Assistance Bed Mobility: Supine to Sit;Sit to Supine     Supine to sit: Supervision Sit to supine: Min guard   General bed mobility comments: increased time with cues for use of belt to self assist  Transfers Overall transfer level: Needs assistance Equipment used: Rolling walker (2 wheeled) Transfers: Sit to/from Stand Sit to Stand: Min guard         General transfer comment: VC's for hand placement and technique with RW, guarding for safety, no assist required.   Ambulation/Gait Ambulation/Gait assistance: Min guard Gait Distance (Feet): 111 Feet Assistive device: Rolling walker (2 wheeled) Gait Pattern/deviations: Step-to pattern;Decreased step length - right;Decreased step length - left;Shuffle Gait velocity: fair   General Gait Details: Min cues for posture, position from RW, to slow pace for safety and for follow through with TDWB   Stairs             Wheelchair Mobility    Modified Rankin (Stroke Patients Only)       Balance Overall balance assessment: Needs assistance Sitting-balance  support: Feet supported Sitting balance-Leahy Scale: Good     Standing balance support: During functional activity;Bilateral upper extremity supported Standing balance-Leahy Scale: Fair                              Cognition Arousal/Alertness: Awake/alert Behavior During Therapy: WFL for tasks assessed/performed;Impulsive Overall Cognitive Status: Within Functional Limits for tasks assessed                                        Exercises Total Joint Exercises Ankle Circles/Pumps: AROM;Both;15 reps;Seated Quad Sets: AROM;Left;10 reps;Supine Heel Slides: AROM;Left;Supine;20 reps Hip ABduction/ADduction: AROM;Left;Supine;20 reps Long Arc Quad: AROM;Left;Seated;15 reps    General Comments        Pertinent Vitals/Pain Pain Assessment: 0-10 Pain Score: 3  Pain Location: Lt hip Pain Descriptors / Indicators: Aching;Discomfort Pain Intervention(s): Limited activity within patient's tolerance;Monitored during session;Premedicated before session    Home Living                      Prior Function            PT Goals (current goals can now be found in the care plan section) Acute Rehab PT Goals Patient Stated Goal: get home and back to independence PT Goal Formulation: With patient Time For Goal Achievement: 02/25/20 Potential to Achieve Goals: Good Progress towards PT goals: Progressing toward goals    Frequency  7X/week      PT Plan Current plan remains appropriate    Co-evaluation              AM-PAC PT "6 Clicks" Mobility   Outcome Measure  Help needed turning from your back to your side while in a flat bed without using bedrails?: None Help needed moving from lying on your back to sitting on the side of a flat bed without using bedrails?: A Little Help needed moving to and from a bed to a chair (including a wheelchair)?: A Little Help needed standing up from a chair using your arms (e.g., wheelchair or bedside  chair)?: A Little Help needed to walk in hospital room?: A Little Help needed climbing 3-5 steps with a railing? : A Little 6 Click Score: 19    End of Session Equipment Utilized During Treatment: Gait belt Activity Tolerance: Patient tolerated treatment well Patient left: in bed;with call bell/phone within reach;with bed alarm set Nurse Communication: Mobility status PT Visit Diagnosis: Muscle weakness (generalized) (M62.81);Difficulty in walking, not elsewhere classified (R26.2)     Time: 0712-1975 PT Time Calculation (min) (ACUTE ONLY): 27 min  Charges:  $Gait Training: 8-22 mins $Therapeutic Exercise: 8-22 mins                     [place] Pager [phone] Office [phone]    Ahamed Hofland 02/21/2020, 9:05 AM

## 2020-02-22 NOTE — Progress Notes (Signed)
Physical Therapy Treatment Patient Details Name: Jimmy Conner MRN: [id number] DOB: Mar 10, 1946 Today's Date: 02/22/2020    History of Present Illness Patient is 74 y.o. male s/p Lt THR (acetabular component revision) on 02/18/20 with PMH significant for prostate cancer, HTN, HIV, OA, anemia, colon cancer, initial Lt THA on 12/15/19.    PT Comments    POD # 4 Assisted OOB. General bed mobility comments: increased time with cues for use of belt to self assist.  General transfer comment: VC's for hand placement and technique with RW, guarding for safety, no assist required. Assisted with a wash up.  Static standing at sink to brush teeth.  General Gait Details: 100% VC's on TTWB which pt tends to dismiss so only amb to and from bathroom this session.  Performed some TE's and applied ICE.    Follow Up Recommendations  Follow surgeon's recommendation for DC plan and follow-up therapies;SNF     Equipment Recommendations       Recommendations for Other Services       Precautions / Restrictions Precautions Precautions: Fall Restrictions Weight Bearing Restrictions: Yes LLE Weight Bearing: Touchdown weight bearing Other Position/Activity Restrictions: TTWB    Mobility  Bed Mobility Overal bed mobility: Needs Assistance Bed Mobility: Supine to Sit     Supine to sit: Supervision;Min guard     General bed mobility comments: increased time with cues for use of belt to self assist  Transfers Overall transfer level: Needs assistance Equipment used: Rolling walker (2 wheeled) Transfers: Sit to/from [facility] Sit to Stand: Min guard Stand pivot transfers: Min guard;Min assist       General transfer comment: VC's for hand placement and technique with RW, guarding for safety, no assist required.   Ambulation/Gait Ambulation/Gait assistance: Min guard;Min assist Gait Distance (Feet): 24 Feet (12 feet to and from bathroom) Assistive device: Rolling walker (2  wheeled) Gait Pattern/deviations: Step-to pattern;Decreased step length - right;Decreased step length - left;Shuffle Gait velocity: WFL   General Gait Details: 100% VC's on TTWB which pt tends to dismiss so only amb to and from bathroom this session   Stairs             Wheelchair Mobility    Modified Rankin (Stroke Patients Only)       Balance                                            Cognition Arousal/Alertness: Awake/alert Behavior During Therapy: WFL for tasks assessed/performed;Impulsive Overall Cognitive Status: Within Functional Limits for tasks assessed                                 General Comments: AxO x 4 pleasant      Exercises   Total Hip Replacement TE's following HEP Handout 10 reps ankle pumps 05 reps knee presses 05 reps heel slides 05 reps SAQ's 05 reps ABD Instructed how to use a belt loop to assist  Followed by ICE     General Comments        Pertinent Vitals/Pain Pain Assessment: 0-10 Pain Score: 7  Pain Location: Lt hip Pain Descriptors / Indicators: Aching;Discomfort;Operative site guarding Pain Intervention(s): Monitored during session;Patient requesting pain meds-RN notified;Ice applied;Repositioned    Home Living  Prior Function            PT Goals (current goals can now be found in the care plan section) Progress towards PT goals: Progressing toward goals    Frequency    7X/week      PT Plan Current plan remains appropriate    Co-evaluation              AM-PAC PT "6 Clicks" Mobility   Outcome Measure  Help needed turning from your back to your side while in a flat bed without using bedrails?: None Help needed moving from lying on your back to sitting on the side of a flat bed without using bedrails?: A Little Help needed moving to and from a bed to a chair (including a wheelchair)?: A Little Help needed standing up from a chair using your  arms (e.g., wheelchair or bedside chair)?: A Little Help needed to walk in hospital room?: A Little Help needed climbing 3-5 steps with a railing? : A Lot 6 Click Score: 18    End of Session Equipment Utilized During Treatment: Gait belt Activity Tolerance: Patient tolerated treatment well Patient left: in chair;with call bell/phone within reach;with chair alarm set Nurse Communication: Mobility status PT Visit Diagnosis: Muscle weakness (generalized) (M62.81);Difficulty in walking, not elsewhere classified (R26.2)     Time: 8288-3374 PT Time Calculation (min) (ACUTE ONLY): 25 min  Charges:  $Gait Training: 8-22 mins $Therapeutic Activity: 8-22 mins                     Rica Koyanagi  PTA Acute  Rehabilitation Services Pager      [phone] Office      [phone]

## 2020-02-22 NOTE — Plan of Care (Signed)
°  Problem: Education: °Goal: Knowledge of General Education information will improve °Description: Including pain rating scale, medication(s)/side effects and non-pharmacologic comfort measures °Outcome: Progressing °  °Problem: Health Behavior/Discharge Planning: °Goal: Ability to manage health-related needs will improve °Outcome: Progressing °  °Problem: Clinical Measurements: °Goal: Ability to maintain clinical measurements within normal limits will improve °Outcome: Progressing °Goal: Will remain free from infection °Outcome: Progressing °Goal: Diagnostic test results will improve °Outcome: Progressing °Goal: Respiratory complications will improve °Outcome: Progressing °Goal: Cardiovascular complication will be avoided °Outcome: Progressing °  °Problem: Activity: °Goal: Risk for activity intolerance will decrease °Outcome: Progressing °  °Problem: Coping: °Goal: Level of anxiety will decrease °Outcome: Progressing °  °Problem: Elimination: °Goal: Will not experience complications related to bowel motility °Outcome: Progressing °  °Problem: Pain Managment: °Goal: General experience of comfort will improve °Outcome: Progressing °  °Problem: Safety: °Goal: Ability to remain free from injury will improve °Outcome: Progressing °  °Problem: Skin Integrity: °Goal: Risk for impaired skin integrity will decrease °Outcome: Progressing °  °

## 2020-02-22 NOTE — TOC Progression Note (Addendum)
Transition of Care [facility]) - Progression Note    Patient Details  Name: Jimmy Conner MRN: [id number] Date of Birth: 1945-11-19  Transition of Care [facility]) CM/SW [place], [place] Phone Number: 02/22/2020, 11:12 AM  Clinical Narrative:    CSW reached out to the [facility] social worker, Abundio Miu [phone] Ext. [phone],confirmed Community Living-SNF placement referral was received and will will underdo review process today. Per SW, a benefits determination should be made today.  FL2 completed.   PASRR requested additional information for review-   Expected Discharge Plan: [place] Barriers to Discharge: Insurance Authorization  Expected Discharge Plan and Services Expected Discharge Plan: [place]   Discharge Planning Services: CM Consult Post Acute Care Choice: [place] Living arrangements for the past 2 months: Single Family Home Expected Discharge Date: 02/19/20               DME Arranged: N/A DME Agency: NA       HH Arranged: NA HH Agency: NA         Social Determinants of Health (SDOH) Interventions    Readmission Risk Interventions No flowsheet data found.

## 2020-02-22 NOTE — Progress Notes (Signed)
     Subjective: 4 Days Post-Op Procedure(s) (LRB): REVISION LEFT TOTAL HIP ARTHROPLASTY, ACETABULAR COMPONENT (Left)   Patient reports pain as mild, controlled with medication.  No reported events throughout the night.  Due to his post-op restrictions and level of care needed post-op he is planning on going to a SNF once approved.      Objective:   VITALS:   Vitals:   02/22/20 1326 02/22/20 1400  BP: 128/87   Pulse: (!) 101 89  Resp: 14   Temp: 98.6 F (37 C)   SpO2: 100%     Dorsiflexion/Plantar flexion intact Incision: dressing C/D/I No cellulitis present Compartment soft  LABS Recent Labs    02/20/20 0316  HGB 9.0*  HCT 27.8*  WBC 11.6*  PLT 214    Recent Labs    02/20/20 0316  NA 135  K 4.2  BUN 21  CREATININE 1.02  GLUCOSE 118*     Assessment/Plan: 4 Days Post-Op Procedure(s) (LRB): REVISION LEFT TOTAL HIP ARTHROPLASTY, ACETABULAR COMPONENT (Left)  TDWB left leg Up with therapy Discharge to SNF once approved        Danae Orleans PA-C  [facility]  [place] [address], [place], [place] [address] Phone: [phone] [email or web address] Facebook  Engineer, structural       .

## 2020-02-22 NOTE — NC FL2 (Signed)
[place] LEVEL OF CARE SCREENING TOOL     IDENTIFICATION  Patient Name: Jimmy Conner Birthdate: [birth year] Sex: male Admission Date (Current Location): 02/18/2020  [facility] and [place] Number:  Herbalist and Address:  [facility],  [place], [place]      Provider Number: [id number]  Attending Physician Name and Address:  Paralee Cancel, MD  Relative Name and Phone Number:  Consuelo Pandy [phone]  [phone]    Current Level of Care: Hospital Recommended Level of Care: [place] Prior Approval Number:    Date Approved/Denied:   PASRR Number: [id number]  Discharge Plan: SNF    Current Diagnoses: Patient Active Problem List   Diagnosis Date Noted  . 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 Family history of colon cancer   . Family history of lung cancer   . Malignant neoplasm of prostate ([place]) 10/15/2018  . UTI (urinary tract infection) 03/03/2018  . Status post placement of ureteral stent 03/03/2018  . HIV (human immunodeficiency virus infection) ([place]) 03/03/2018  . HTN (hypertension) 03/03/2018  . AKI (acute kidney injury) ([place]) 03/03/2018  . Personal history of other malignant neoplasm of skin 08/05/2014    Orientation RESPIRATION BLADDER Height & Weight     Self, Time, Situation, Place  Normal Continent Weight: 185 lb (83.9 kg) Height:  5\' 8"  (172.7 cm)  BEHAVIORAL SYMPTOMS/MOOD NEUROLOGICAL BOWEL NUTRITION STATUS      Continent  (Low Sodium Heart Healthy)  AMBULATORY STATUS COMMUNICATION OF NEEDS Skin   Limited Assist Verbally Surgical wounds                       Personal Care Assistance Level of Assistance  Bathing, Feeding, Dressing Bathing Assistance: Limited assistance Feeding assistance: Independent Dressing Assistance: Limited assistance     Functional Limitations Info  Sight, Hearing, Speech Sight Info: Adequate Hearing Info:  Adequate Speech Info: Adequate    SPECIAL CARE FACTORS FREQUENCY  PT (By licensed PT), OT (By licensed OT)     PT Frequency: 5x/week OT Frequency: 5x/week            Contractures Contractures Info: Not present    Additional Factors Info  Code Status, Allergies Code Status Info: Fullcode Allergies Info: Allergies: Hydrocodone-acetaminophen, Oxycodone Hcl           Current Medications (02/22/2020):  This is the current hospital active medication list Current Facility-Administered Medications  Medication Dose Route Frequency Provider Last Rate Last Admin  . 0.9 %  sodium chloride infusion   Intravenous Continuous Danae Orleans, PA-C   Stopped at 02/19/20 0165  . abacavir-dolutegravir-lamiVUDine (TRIUMEQ) 537-48-270 MG per tablet 1 tablet  1 tablet Oral Daily Danae Orleans, PA-C   1 tablet at 02/22/20 0859  . acetaminophen (TYLENOL) tablet 1,000 mg  1,000 mg Oral Q8H Babish, [place]man [place], PA-C   1,000 mg at 02/22/20 0442  . alum & mag hydroxide-simeth (MAALOX/MYLANTA) 200-200-20 MG/5ML suspension 15 mL  15 mL Oral Q4H PRN Babish, Matthew, PA-C      . amLODipine (NORVASC) tablet 5 mg  5 mg Oral Daily Danae Orleans, PA-C   5 mg at 02/22/20 0859  . aspirin chewable tablet 81 mg  81 mg Oral BID Danae Orleans, PA-C   81 mg at 02/22/20 0859  . bisacodyl (DULCOLAX) suppository 10 mg  10 mg Rectal Daily PRN Danae Orleans, PA-C      . celecoxib (  CELEBREX) capsule 200 mg  200 mg Oral BID Danae Orleans, PA-C   200 mg at 02/22/20 0858  . diphenhydrAMINE (BENADRYL) 12.5 MG/5ML elixir 12.5-25 mg  12.5-25 mg Oral Q4H PRN Danae Orleans, PA-C      . docusate sodium (COLACE) capsule 100 mg  100 mg Oral BID Danae Orleans, PA-C   100 mg at 02/22/20 0858  . ferrous sulfate tablet 325 mg  325 mg Oral TID PC Danae Orleans, PA-C   325 mg at 02/22/20 0859  . HYDROmorphone (DILAUDID) injection 0.5-1 mg  0.5-1 mg Intravenous Q2H PRN Babish, Matthew, PA-C      . magnesium citrate solution 1  Bottle  1 Bottle Oral Once PRN Babish, Matthew, PA-C      . menthol-cetylpyridinium (CEPACOL) lozenge 3 mg  1 lozenge Oral PRN Danae Orleans, PA-C       Or  . phenol (CHLORASEPTIC) mouth spray 1 spray  1 spray Mouth/Throat PRN Danae Orleans, PA-C      . methocarbamol (ROBAXIN) tablet 500 mg  500 mg Oral Q6H PRN Danae Orleans, PA-C   500 mg at 02/22/20 0442   Or  . methocarbamol (ROBAXIN) 500 mg in dextrose 5 % 50 mL IVPB  500 mg Intravenous Q6H PRN Babish, Matthew, PA-C      . metoCLOPramide (REGLAN) tablet 5-10 mg  5-10 mg Oral Q8H PRN Danae Orleans, PA-C       Or  . metoCLOPramide (REGLAN) injection 5-10 mg  5-10 mg Intravenous Q8H PRN Babish, Matthew, PA-C      . ondansetron (ZOFRAN) tablet 4 mg  4 mg Oral Q6H PRN Danae Orleans, PA-C       Or  . ondansetron (ZOFRAN) injection 4 mg  4 mg Intravenous Q6H PRN Babish, Matthew, PA-C      . polyethylene glycol (MIRALAX / GLYCOLAX) packet 17 g  17 g Oral BID Babish, Matthew, PA-C   17 g at 02/20/20 1120  . sulfamethoxazole-trimethoprim (BACTRIM DS) 800-160 MG per tablet 1 tablet  1 tablet Oral Once per day on Mon Wed Fri Babish, Matthew, PA-C   1 tablet at 02/22/20 0859  . Tenofovir Alafenamide Fumarate TABS 25 mg  25 mg Oral Daily Danae Orleans, PA-C   25 mg at 02/21/20 1012  . traMADol (ULTRAM) tablet 50-100 mg  50-100 mg Oral Q6H PRN Danae Orleans, PA-C   50 mg at 02/21/20 1851     Discharge Medications: Please see discharge summary for a list of discharge medications.  Relevant Imaging Results:  Relevant Lab Results:   Additional Information ssn# [id number]  Lia Hopping, LCSW

## 2020-02-22 NOTE — Progress Notes (Addendum)
Transition of Care (TOC) -30 day Note     PASRR    Patient Details   Name: Jimmy Conner  MRN: [id number]  Date of Birth: [birth year]     Transition of Care [facility]) CM/SW Contact   Name: Kathrin Greathouse   Phone Number: [phone]  Date: 02/22/2020   Time: 12:53PM     MUST ID: [id number]     To Whom it May Concern:     Please be advised that the above patient will require a short-term nursing home stay, anticipated 30 days or less rehabilitation and strengthening. The plan is for return home.

## 2020-02-23 ENCOUNTER — Encounter (HOSPITAL_COMMUNITY): Payer: Self-pay | Admitting: Orthopedic Surgery

## 2020-02-23 LAB — SARS CORONAVIRUS 2 (TAT 6-24 HRS): SARS Coronavirus 2: NEGATIVE

## 2020-02-23 NOTE — TOC Transition Note (Signed)
Transition of Care [facility]) - CM/SW Discharge Note   Patient Details  Name: Jimmy Conner MRN: [id number] Date of Birth: 1946/05/04  Transition of Care [facility]) CM/SW Contact:  Lia Hopping, [place] Phone Number: 02/23/2020, 1:35 PM   Clinical Narrative:    Patient approved by [facility] for 32 days of SNF benefits to rehab.  [facility] case manager faxed a list of in network facilities. CSW reached out to [facility].  [facility] accepted.  Patient discharge pending covid test and pasrr.    Final next level of care: Skilled Nursing Facility Barriers to Discharge: [place] (PASRR), Other (comment)   Patient Goals and CMS Choice        Discharge Placement                       Discharge Plan and Services   Discharge Planning Services: CM Consult Post Acute Care Choice: [place]          DME Arranged: N/A DME Agency: NA       HH Arranged: NA HH Agency: NA        Social Determinants of Health (SDOH) Interventions     Readmission Risk Interventions No flowsheet data found.

## 2020-02-23 NOTE — Progress Notes (Signed)
Physical Therapy Treatment Patient Details Name: Jimmy Conner MRN: [id number] DOB: 1945/11/12 Today's Date: 02/23/2020    History of Present Illness Patient is 74 y.o. male s/p Lt THR (acetabular component revision) on 02/18/20 with PMH significant for prostate cancer, HTN, HIV, OA, anemia, colon cancer, initial Lt THA on 12/15/19.    PT Comments    Pod # 4 Assisted OOB to amb in hallway Then returned to room to perform some TE's following HEP handout.  Instructed on proper tech, freq as well as use of ICE.   Pt will need ST Rehab at SNF prior to safely returning home due to TTWB status and limited activity.    Follow Up Recommendations  Follow surgeon's recommendation for DC plan and follow-up therapies;SNF     Equipment Recommendations  Rolling walker with 5" wheels    Recommendations for Other Services       Precautions / Restrictions Precautions Precautions: Fall Restrictions Other Position/Activity Restrictions: TTWB    Mobility  Bed Mobility Overal bed mobility: Needs Assistance Bed Mobility: Supine to Sit     Supine to sit: Supervision;Min guard     General bed mobility comments: increased time with cues for use of belt to self assist  Transfers Overall transfer level: Needs assistance Equipment used: Rolling walker (2 wheeled) Transfers: Sit to/from [facility] Sit to Stand: Min guard Stand pivot transfers: Min guard;Min assist       General transfer comment: VC's for hand placement and technique with RW, guarding for safety, no assist required.   Ambulation/Gait Ambulation/Gait assistance: Min guard;Min assist Gait Distance (Feet): 44 Feet Assistive device: Rolling walker (2 wheeled) Gait Pattern/deviations: Step-to pattern;Decreased step length - right;Decreased step length - left;Shuffle Gait velocity: WFL   General Gait Details: <50% vc'S ON PROPER ttwb TOLERATED AN INCREASED DISTANCE   Stairs             Wheelchair  Mobility    Modified Rankin (Stroke Patients Only)       Balance                                            Cognition   Behavior During Therapy: WFL for tasks assessed/performed;Impulsive Overall Cognitive Status: Within Functional Limits for tasks assessed                                 General Comments: AxO x 4 pleasant, watching a movie and laughing      Exercises   Total Hip Replacement TE's following HEP Handout 10 reps ankle pumps 05 reps knee presses 05 reps heel slides 05 reps SAQ's 05 reps ABD Instructed how to use a belt loop to assist  Followed by ICE     General Comments        Pertinent Vitals/Pain Pain Assessment: 0-10 Pain Score: 2  Pain Location: Lt hip Pain Descriptors / Indicators: Aching;Discomfort;Operative site guarding Pain Intervention(s): Monitored during session;Ice applied;Repositioned    Home Living                      Prior Function            PT Goals (current goals can now be found in the care plan section) Progress towards PT goals: Progressing toward goals    Frequency  7X/week      PT Plan Current plan remains appropriate    Co-evaluation              AM-PAC PT "6 Clicks" Mobility   Outcome Measure  Help needed turning from your back to your side while in a flat bed without using bedrails?: None Help needed moving from lying on your back to sitting on the side of a flat bed without using bedrails?: A Little Help needed moving to and from a bed to a chair (including a wheelchair)?: A Little Help needed standing up from a chair using your arms (e.g., wheelchair or bedside chair)?: A Little Help needed to walk in hospital room?: A Little Help needed climbing 3-5 steps with a railing? : A Lot 6 Click Score: 18    End of Session Equipment Utilized During Treatment: Gait belt Activity Tolerance: Patient tolerated treatment well Patient left: in bed;with call  bell/phone within reach Nurse Communication: Mobility status PT Visit Diagnosis: Muscle weakness (generalized) (M62.81);Difficulty in walking, not elsewhere classified (R26.2)     Time: 0223-3612 PT Time Calculation (min) (ACUTE ONLY): 25 min  Charges:  $Gait Training: 8-22 mins $Therapeutic Exercise: 8-22 mins                     {[place] Riesen  PTA Acute  Rehabilitation Services Pager      [phone] Office      [phone]

## 2020-02-23 NOTE — Progress Notes (Signed)
Patient ID: Jimmy Conner, male   DOB: 10-Apr-1946, 74 y.o.   MRN: [id number] Subjective: 5 Days Post-Op Procedure(s) (LRB): REVISION LEFT TOTAL HIP ARTHROPLASTY, ACETABULAR COMPONENT (Left)    Patient reports pain as mild.  Did report having some pain in hip area last night up to 5/10 but not related to weight bearing.  Not as much therapy yesterday as he would have liked  Objective:   VITALS:   Vitals:   02/23/20 0549 02/23/20 1012  BP: (!) 164/92 (!) 148/82  Pulse: 92 93  Resp: 18 17  Temp: 97.9 F (36.6 C) 97.8 F (36.6 C)  SpO2: 99% 100%    Neurovascular intact Incision: dressing C/D/I  LABS No results for input(s): HGB, HCT, WBC, PLT in the last 72 hours.  No results for input(s): NA, K, BUN, CREATININE, GLUCOSE in the last 72 hours.  No results for input(s): LABPT, INR in the last 72 hours.   Assessment/Plan: 5 Days Post-Op Procedure(s) (LRB): REVISION LEFT TOTAL HIP ARTHROPLASTY, ACETABULAR COMPONENT (Left)   Up with therapy Discharge to SNF once approved through his insurance  TDWB LLE RTC in 2 weeks

## 2020-02-23 NOTE — Plan of Care (Signed)
  Problem: Education: Goal: Knowledge of General Education information will improve Description: Including pain rating scale, medication(s)/side effects and non-pharmacologic comfort measures Outcome: Progressing   Problem: Activity: Goal: Risk for activity intolerance will decrease Outcome: Progressing   Problem: Pain Managment: Goal: General experience of comfort will improve Outcome: Progressing   

## 2020-02-24 NOTE — Progress Notes (Signed)
     Subjective: 6 Days Post-Op Procedure(s) (LRB): REVISION LEFT TOTAL HIP ARTHROPLASTY, ACETABULAR COMPONENT (Left)   Patient reports pain as mild.  Did report having some pain in hip area yesterday, but it's better.  He is looking forward to progressing with PT.  Stated that he thinks that they have a facility ready for him. Ready to be discharged to SNF today if everything is arranged.     Objective:   VITALS:   Vitals:   02/23/20 1811 02/23/20 2154  BP: 133/89 (!) 138/91  Pulse: 75 78  Resp: 16   Temp: 98.2 F (36.8 C) 98 F (36.7 C)  SpO2: 99% 100%    Dorsiflexion/Plantar flexion intact Incision: dressing C/D/I No cellulitis present Compartment soft  LABS No results for input(s): HGB, HCT, WBC, PLT in the last 72 hours.  No results for input(s): NA, K, BUN, CREATININE, GLUCOSE in the last 72 hours.   Assessment/Plan: 6 Days Post-Op Procedure(s) (LRB): REVISION LEFT TOTAL HIP ARTHROPLASTY, ACETABULAR COMPONENT (Left)  TDWB left leg Up with therapy Discharge to SNF  Follow up in 2 weeks at [facility] Follow up with OLIN,Maleki Hippe D in 2 weeks.  Contact information:  [facility] [address] [place] [phone]            Danae Orleans PA-C  [facility]  [place] [address], [place], [place], [place] [address] Phone: [phone] [email or web address] Facebook  [employer]

## 2020-02-24 NOTE — TOC Transition Note (Addendum)
Transition of Care [facility]) - CM/SW Discharge Note   Patient Details  Name: Jimmy Conner MRN: [id number] Date of Birth: 02/09/1946  Transition of Care [facility]) CM/SW Contact:  Lia Hopping, LCSW Phone Number: 02/24/2020, 10:43 AM   Clinical Narrative:    PASRR received.  [facility] ready to accept the patient.  Room 7016 Nurse call report to: [phone]. Spouse to transport.   Final next level of care: Skilled Nursing Facility Barriers to Discharge: Barriers Resolved   Patient Goals and CMS Choice   CMS Medicare.gov Compare Post Acute Care list provided to:: Patient Choice offered to / list presented to : Patient  Discharge Placement PASRR number recieved: 02/24/20            Patient chooses bed at: [facility] Patient to be transferred to facility by: Patient spouse will transporrt Name of family member notified: Brendia Sacks [phone] Patient and family notified of of transfer: 02/24/20  Discharge Plan and Services   Discharge Planning Services: CM Consult Post Acute Care Choice: [place]          DME Arranged: N/A DME Agency: NA       HH Arranged: NA HH Agency: NA        Social Determinants of Health (SDOH) Interventions     Readmission Risk Interventions No flowsheet data found.
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
## 2020-02-24 NOTE — Progress Notes (Signed)
Patient discharged to [facility] w/ wife. Given all belongings, packet, equipment. Report called to facility. Escorted to pov via w/c.

## 2020-02-26 ENCOUNTER — Encounter (HOSPITAL_COMMUNITY): Payer: Self-pay | Admitting: Orthopedic Surgery

## 2020-03-04 ENCOUNTER — Encounter (HOSPITAL_COMMUNITY): Payer: Self-pay | Admitting: Orthopedic Surgery

## 2022-02-04 IMAGING — RF DG HIP (WITH PELVIS) OPERATIVE*L*
1 series · 6 of 6 positions shown · non-contrast
Comparison: December 15, 2019

CLINICAL DATA: Status post total hip revision on the left

EXAM:
OPERATIVE LEFT HIP (WITH PELVIS IF PERFORMED) 1 VIEWS
TECHNIQUE: Fluoroscopic spot image(s) were submitted for interpretation
post-operatively.
FLUOROSCOPY TIME:  0 minutes 27 seconds; 6 acquired images

[Series 1: unknown protocol · 0.20mm/px · 6 of 6 slices shown]
[im 1/6]
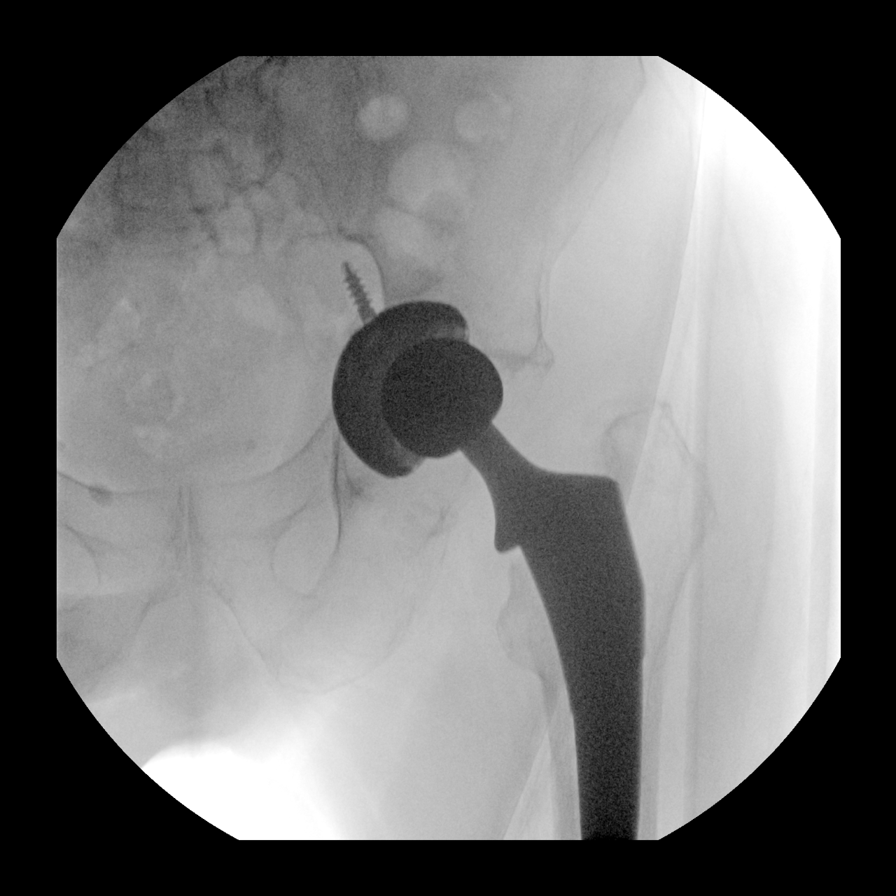
[im 2/6]
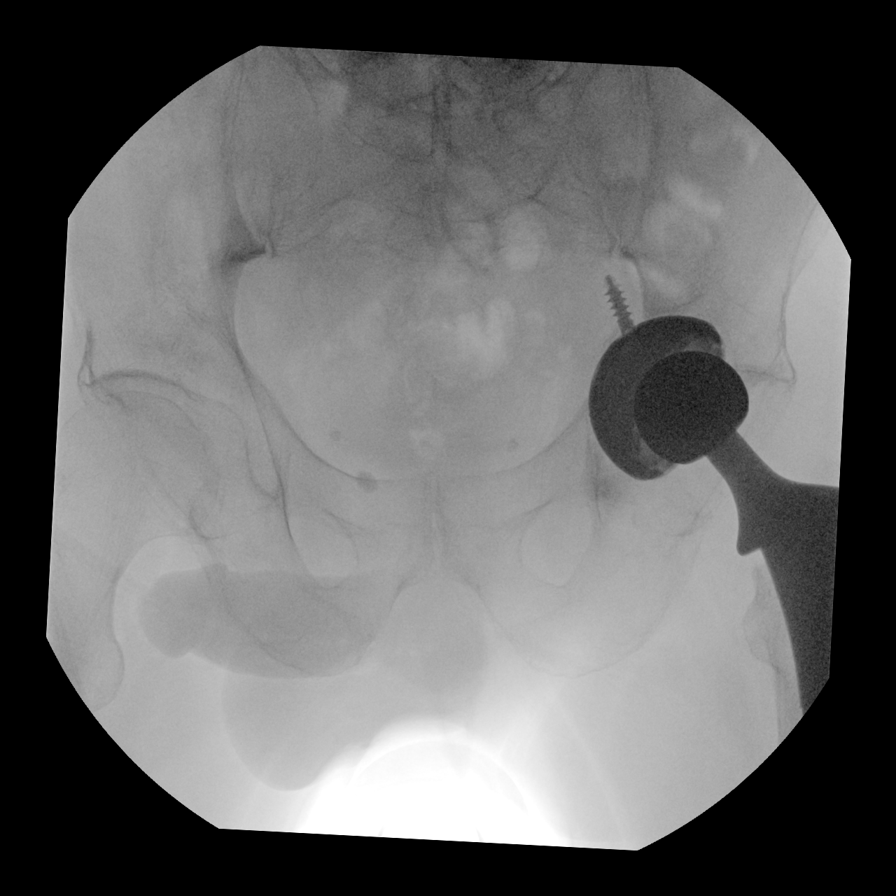
[im 3/6]
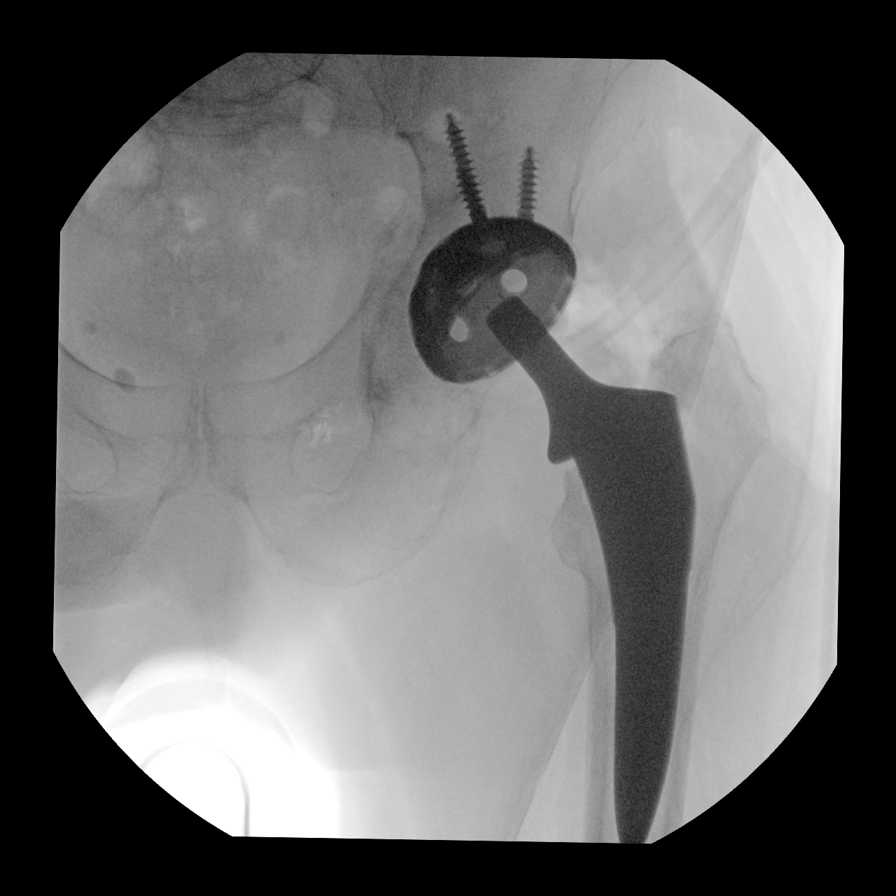
[im 4/6]
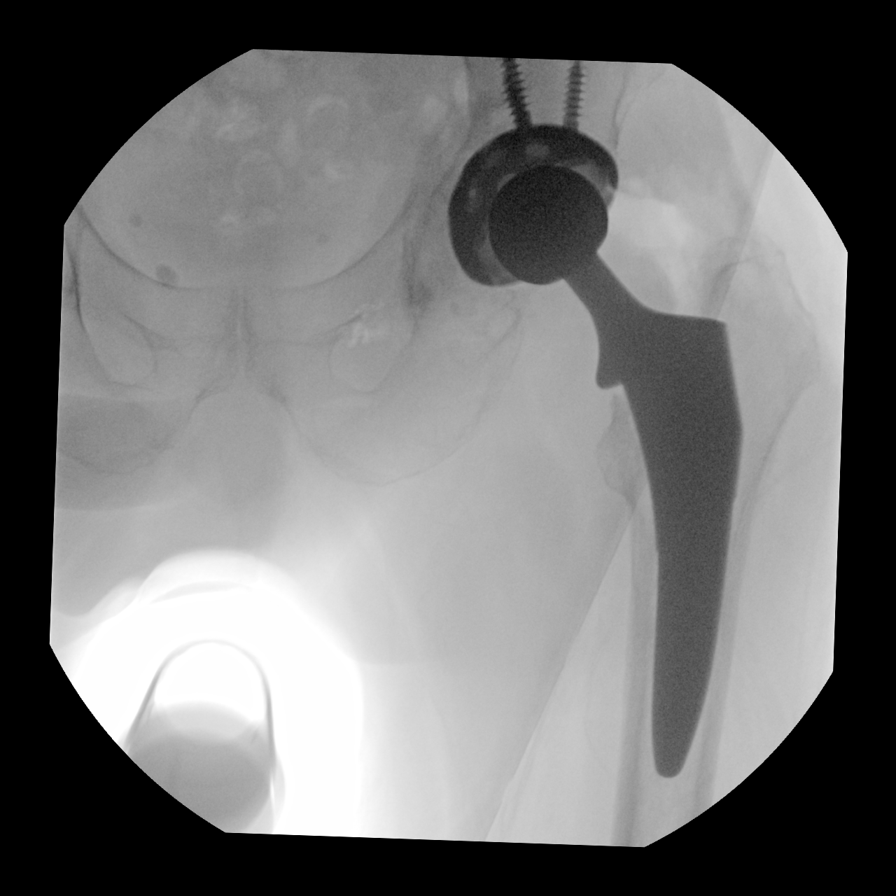
[im 5/6]
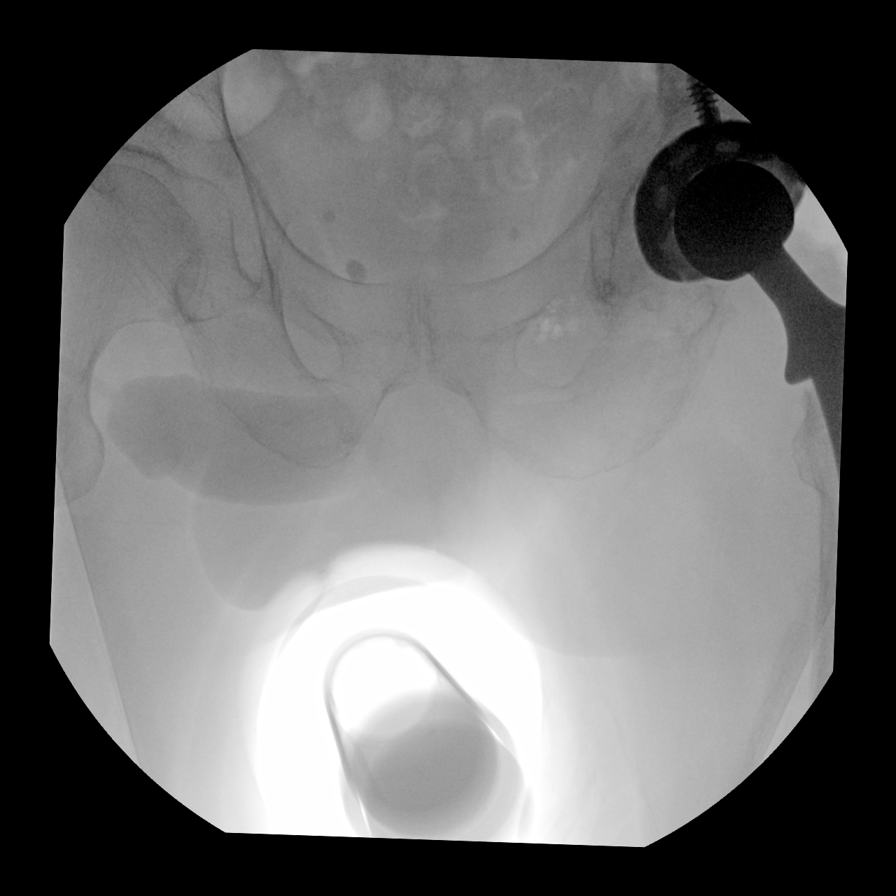
[im 6/6]
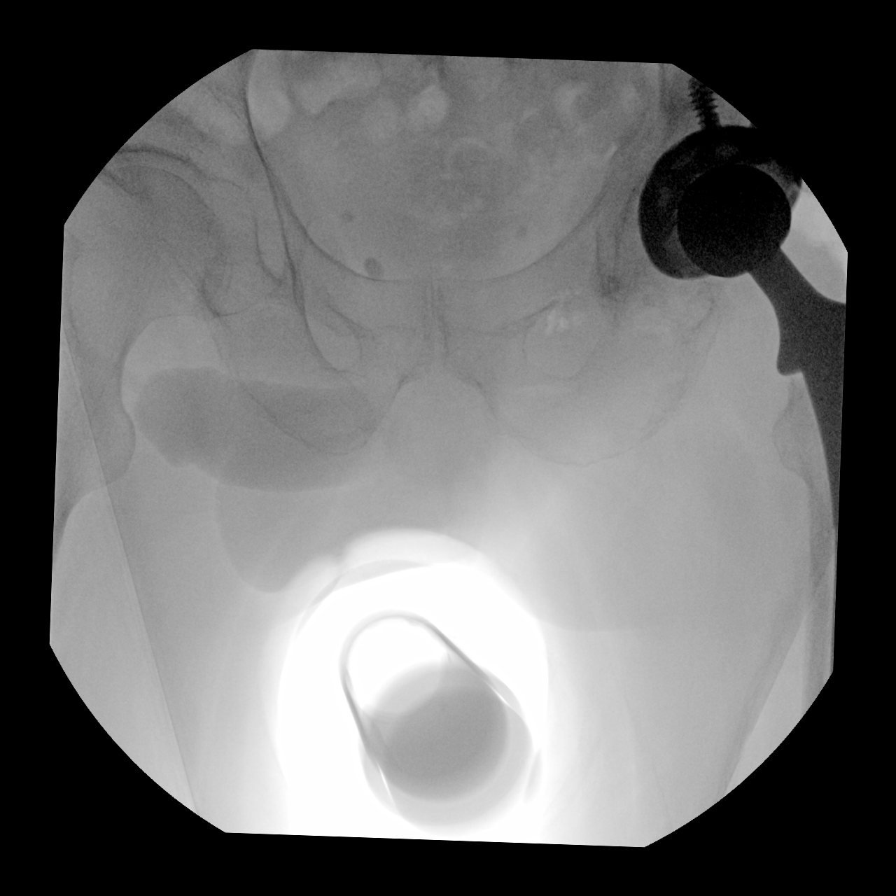

[6 of 6 positions shown; findings below may reference images not displayed]

FINDINGS: Series of images show replacement of total hip prosthesis on the
left. Final image shows apparent prosthetic components well-seated.
No fracture or dislocation. Slight narrowing right hip joint.
IMPRESSION: Replacement total hip prosthesis on the left with prosthetic
components well-seated on frontal view on final submitted image. No
fracture or dislocation. Mild narrowing right hip joint.

## 2022-04-20 ENCOUNTER — Other Ambulatory Visit (HOSPITAL_COMMUNITY): Payer: Self-pay | Admitting: Physician Assistant

## 2022-04-20 DIAGNOSIS — R9721 Rising PSA following treatment for malignant neoplasm of prostate: Secondary | ICD-10-CM

## 2022-04-30 ENCOUNTER — Ambulatory Visit (HOSPITAL_COMMUNITY)
Admission: RE | Admit: 2022-04-30 | Discharge: 2022-04-30 | Disposition: A | Discharge status: Home / Self Care | Payer: No Typology Code available for payment source | Source: Ambulatory Visit | Attending: Physician Assistant | Admitting: Physician Assistant

## 2022-04-30 DIAGNOSIS — R9721 Rising PSA following treatment for malignant neoplasm of prostate: Secondary | ICD-10-CM | POA: Diagnosis not present

## 2022-04-30 MED ORDER — PIFLIFOLASTAT F 18 (PYLARIFY) INJECTION
9.0000 | Freq: Once | INTRAVENOUS | Status: AC
  Administered 2022-04-30: 9.34 via INTRAVENOUS

## 2022-06-13 ENCOUNTER — Other Ambulatory Visit: Payer: Self-pay | Admitting: Orthopedic Surgery

## 2022-06-13 DIAGNOSIS — Z96642 Presence of left artificial hip joint: Secondary | ICD-10-CM

## 2022-07-18 ENCOUNTER — Ambulatory Visit
Admission: RE | Admit: 2022-07-18 | Discharge: 2022-07-18 | Disposition: A | Discharge status: Home / Self Care | Payer: No Typology Code available for payment source | Source: Ambulatory Visit | Attending: Orthopedic Surgery | Admitting: Orthopedic Surgery

## 2022-07-18 ENCOUNTER — Ambulatory Visit
Admission: RE | Admit: 2022-07-18 | Discharge: 2022-07-18 | Disposition: A | Discharge status: Home / Self Care | Payer: Medicare PPO | Source: Ambulatory Visit | Attending: Orthopedic Surgery | Admitting: Orthopedic Surgery

## 2022-07-18 DIAGNOSIS — Z96642 Presence of left artificial hip joint: Secondary | ICD-10-CM

## 2022-08-16 ENCOUNTER — Other Ambulatory Visit: Payer: Self-pay

## 2022-08-16 ENCOUNTER — Emergency Department (HOSPITAL_COMMUNITY): Payer: No Typology Code available for payment source

## 2022-08-16 ENCOUNTER — Inpatient Hospital Stay (HOSPITAL_COMMUNITY)
Admission: EM | Admit: 2022-08-16 | Discharge: 2022-08-23 | DRG: 563 | Disposition: A | Discharge status: Skilled Nursing Facility | Payer: No Typology Code available for payment source | Attending: Internal Medicine | Admitting: Internal Medicine

## 2022-08-16 DIAGNOSIS — Z751 Person awaiting admission to adequate facility elsewhere: Secondary | ICD-10-CM

## 2022-08-16 DIAGNOSIS — N179 Acute kidney failure, unspecified: Secondary | ICD-10-CM | POA: Diagnosis present

## 2022-08-16 DIAGNOSIS — Z85828 Personal history of other malignant neoplasm of skin: Secondary | ICD-10-CM

## 2022-08-16 DIAGNOSIS — S42309A Unspecified fracture of shaft of humerus, unspecified arm, initial encounter for closed fracture: Secondary | ICD-10-CM | POA: Diagnosis not present

## 2022-08-16 DIAGNOSIS — S42352A Displaced comminuted fracture of shaft of humerus, left arm, initial encounter for closed fracture: Secondary | ICD-10-CM | POA: Diagnosis not present

## 2022-08-16 DIAGNOSIS — Z800 Family history of malignant neoplasm of digestive organs: Secondary | ICD-10-CM

## 2022-08-16 DIAGNOSIS — S42302A Unspecified fracture of shaft of humerus, left arm, initial encounter for closed fracture: Secondary | ICD-10-CM | POA: Diagnosis present

## 2022-08-16 DIAGNOSIS — R531 Weakness: Secondary | ICD-10-CM | POA: Diagnosis present

## 2022-08-16 DIAGNOSIS — Z79899 Other long term (current) drug therapy: Secondary | ICD-10-CM

## 2022-08-16 DIAGNOSIS — Z885 Allergy status to narcotic agent status: Secondary | ICD-10-CM

## 2022-08-16 DIAGNOSIS — E559 Vitamin D deficiency, unspecified: Secondary | ICD-10-CM | POA: Diagnosis present

## 2022-08-16 DIAGNOSIS — W19XXXA Unspecified fall, initial encounter: Secondary | ICD-10-CM

## 2022-08-16 DIAGNOSIS — Z801 Family history of malignant neoplasm of trachea, bronchus and lung: Secondary | ICD-10-CM

## 2022-08-16 DIAGNOSIS — Z8042 Family history of malignant neoplasm of prostate: Secondary | ICD-10-CM

## 2022-08-16 DIAGNOSIS — Y92009 Unspecified place in unspecified non-institutional (private) residence as the place of occurrence of the external cause: Secondary | ICD-10-CM

## 2022-08-16 DIAGNOSIS — M810 Age-related osteoporosis without current pathological fracture: Secondary | ICD-10-CM | POA: Diagnosis present

## 2022-08-16 DIAGNOSIS — I10 Essential (primary) hypertension: Secondary | ICD-10-CM | POA: Diagnosis present

## 2022-08-16 DIAGNOSIS — B20 Human immunodeficiency virus [HIV] disease: Secondary | ICD-10-CM | POA: Diagnosis present

## 2022-08-16 DIAGNOSIS — W1830XA Fall on same level, unspecified, initial encounter: Secondary | ICD-10-CM | POA: Diagnosis present

## 2022-08-16 DIAGNOSIS — Z8546 Personal history of malignant neoplasm of prostate: Secondary | ICD-10-CM

## 2022-08-16 DIAGNOSIS — Z96642 Presence of left artificial hip joint: Secondary | ICD-10-CM | POA: Diagnosis present

## 2022-08-16 DIAGNOSIS — R52 Pain, unspecified: Principal | ICD-10-CM

## 2022-08-16 LAB — CBC
HCT: 31.3 % — ABNORMAL LOW (ref 39.0–52.0)
Hemoglobin: 10.6 g/dL — ABNORMAL LOW (ref 13.0–17.0)
MCH: 32.3 pg (ref 26.0–34.0)
MCHC: 33.9 g/dL (ref 30.0–36.0)
MCV: 95.4 fL (ref 80.0–100.0)
Platelets: 192 10*3/uL (ref 150–400)
RBC: 3.28 MIL/uL — ABNORMAL LOW (ref 4.22–5.81)
RDW: 13.2 % (ref 11.5–15.5)
WBC: 8.2 10*3/uL (ref 4.0–10.5)
nRBC: 0 % (ref 0.0–0.2)

## 2022-08-16 LAB — BASIC METABOLIC PANEL
Anion gap: 9 (ref 5–15)
BUN: 29 mg/dL — ABNORMAL HIGH (ref 8–23)
CO2: 25 mmol/L (ref 22–32)
Calcium: 9.4 mg/dL (ref 8.9–10.3)
Chloride: 108 mmol/L (ref 98–111)
Creatinine, Ser: 1.72 mg/dL — ABNORMAL HIGH (ref 0.61–1.24)
GFR, Estimated: 41 mL/min — ABNORMAL LOW (ref >60)
Glucose, Bld: 111 mg/dL — ABNORMAL HIGH (ref 70–99)
Potassium: 3.4 mmol/L — ABNORMAL LOW (ref 3.5–5.1)
Sodium: 142 mmol/L (ref 135–145)

## 2022-08-16 LAB — CK: Total CK: 412 U/L — ABNORMAL HIGH (ref 49–397)

## 2022-08-16 MED ORDER — HYDROMORPHONE HCL 1 MG/ML IJ SOLN
0.5000 mg | INTRAMUSCULAR | Status: AC | PRN
  Administered 2022-08-17 – 2022-08-18 (×3): 0.5 mg via INTRAVENOUS
  Filled 2022-08-16 (×2): qty 1
  Filled 2022-08-16: qty 0.5

## 2022-08-16 MED ORDER — HYDROMORPHONE HCL 1 MG/ML IJ SOLN
0.5000 mg | Freq: Once | INTRAMUSCULAR | Status: AC
  Administered 2022-08-16: 0.5 mg via INTRAVENOUS
  Filled 2022-08-16: qty 1

## 2022-08-16 MED ORDER — ONDANSETRON HCL 4 MG/2ML IJ SOLN: 4.0000 mg | INTRAMUSCULAR | Status: DC | PRN

## 2022-08-16 MED ORDER — SODIUM CHLORIDE 0.9 % IV BOLUS
1000.0000 mL | Freq: Once | INTRAVENOUS | Status: AC
  Administered 2022-08-16: 1000 mL via INTRAVENOUS

## 2022-08-16 MED ORDER — SODIUM CHLORIDE 0.9 % IV SOLN: INTRAVENOUS | Status: DC

## 2022-08-16 MED ORDER — ONDANSETRON HCL 4 MG/2ML IJ SOLN
4.0000 mg | Freq: Once | INTRAMUSCULAR | Status: AC
  Administered 2022-08-16: 4 mg via INTRAVENOUS
  Filled 2022-08-16: qty 2
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
## 2022-08-16 NOTE — ED Triage Notes (Signed)
Pt fell yesterday and fractured humerus pt was seen and discharged from [facility] in [place]. Pt was seen at [facility] today and Dr. Stann Mainland sent pt here to be admitted due to pt unable to stay at his apartment by himself. Dr. Stann Mainland advised that he would be seeing pt in hospital tomorrow. Per pt friend the Doctor has a special type brace he wants to try on pt before having to do surgery.

## 2022-08-16 NOTE — ED Triage Notes (Signed)
Pt complains of excruciating arm pain 10/10

## 2022-08-17 ENCOUNTER — Encounter (HOSPITAL_COMMUNITY): Payer: Self-pay | Admitting: Internal Medicine

## 2022-08-17 ENCOUNTER — Other Ambulatory Visit: Payer: Self-pay

## 2022-08-17 ENCOUNTER — Inpatient Hospital Stay (HOSPITAL_COMMUNITY): Payer: No Typology Code available for payment source

## 2022-08-17 DIAGNOSIS — R531 Weakness: Secondary | ICD-10-CM | POA: Diagnosis present

## 2022-08-17 DIAGNOSIS — Z8042 Family history of malignant neoplasm of prostate: Secondary | ICD-10-CM | POA: Diagnosis not present

## 2022-08-17 DIAGNOSIS — Z801 Family history of malignant neoplasm of trachea, bronchus and lung: Secondary | ICD-10-CM | POA: Diagnosis not present

## 2022-08-17 DIAGNOSIS — Z800 Family history of malignant neoplasm of digestive organs: Secondary | ICD-10-CM | POA: Diagnosis not present

## 2022-08-17 DIAGNOSIS — S42309A Unspecified fracture of shaft of humerus, unspecified arm, initial encounter for closed fracture: Secondary | ICD-10-CM | POA: Diagnosis present

## 2022-08-17 DIAGNOSIS — S42342A Displaced spiral fracture of shaft of humerus, left arm, initial encounter for closed fracture: Secondary | ICD-10-CM

## 2022-08-17 DIAGNOSIS — S42302A Unspecified fracture of shaft of humerus, left arm, initial encounter for closed fracture: Secondary | ICD-10-CM | POA: Diagnosis not present

## 2022-08-17 DIAGNOSIS — E559 Vitamin D deficiency, unspecified: Secondary | ICD-10-CM | POA: Diagnosis present

## 2022-08-17 DIAGNOSIS — W19XXXA Unspecified fall, initial encounter: Secondary | ICD-10-CM | POA: Diagnosis not present

## 2022-08-17 DIAGNOSIS — N179 Acute kidney failure, unspecified: Secondary | ICD-10-CM | POA: Diagnosis present

## 2022-08-17 DIAGNOSIS — W1830XA Fall on same level, unspecified, initial encounter: Secondary | ICD-10-CM | POA: Diagnosis present

## 2022-08-17 DIAGNOSIS — I10 Essential (primary) hypertension: Secondary | ICD-10-CM | POA: Diagnosis present

## 2022-08-17 DIAGNOSIS — Z79899 Other long term (current) drug therapy: Secondary | ICD-10-CM | POA: Diagnosis not present

## 2022-08-17 DIAGNOSIS — Y92009 Unspecified place in unspecified non-institutional (private) residence as the place of occurrence of the external cause: Secondary | ICD-10-CM | POA: Diagnosis not present

## 2022-08-17 DIAGNOSIS — Z751 Person awaiting admission to adequate facility elsewhere: Secondary | ICD-10-CM | POA: Diagnosis not present

## 2022-08-17 DIAGNOSIS — B20 Human immunodeficiency virus [HIV] disease: Secondary | ICD-10-CM | POA: Diagnosis present

## 2022-08-17 DIAGNOSIS — Z885 Allergy status to narcotic agent status: Secondary | ICD-10-CM | POA: Diagnosis not present

## 2022-08-17 DIAGNOSIS — Z96642 Presence of left artificial hip joint: Secondary | ICD-10-CM | POA: Diagnosis present

## 2022-08-17 DIAGNOSIS — Z8546 Personal history of malignant neoplasm of prostate: Secondary | ICD-10-CM | POA: Diagnosis not present

## 2022-08-17 DIAGNOSIS — M810 Age-related osteoporosis without current pathological fracture: Secondary | ICD-10-CM | POA: Diagnosis present

## 2022-08-17 DIAGNOSIS — Z85828 Personal history of other malignant neoplasm of skin: Secondary | ICD-10-CM | POA: Diagnosis not present

## 2022-08-17 DIAGNOSIS — S42352A Displaced comminuted fracture of shaft of humerus, left arm, initial encounter for closed fracture: Secondary | ICD-10-CM | POA: Diagnosis present

## 2022-08-17 LAB — BASIC METABOLIC PANEL
Anion gap: 10 (ref 5–15)
BUN: 23 mg/dL (ref 8–23)
CO2: 25 mmol/L (ref 22–32)
Calcium: 8.4 mg/dL — ABNORMAL LOW (ref 8.9–10.3)
Chloride: 109 mmol/L (ref 98–111)
Creatinine, Ser: 1.34 mg/dL — ABNORMAL HIGH (ref 0.61–1.24)
GFR, Estimated: 56 mL/min — ABNORMAL LOW (ref >60)
Glucose, Bld: 111 mg/dL — ABNORMAL HIGH (ref 70–99)
Potassium: 3.5 mmol/L (ref 3.5–5.1)
Sodium: 144 mmol/L (ref 135–145)

## 2022-08-17 LAB — CBC
HCT: 28 % — ABNORMAL LOW (ref 39.0–52.0)
Hemoglobin: 9.5 g/dL — ABNORMAL LOW (ref 13.0–17.0)
MCH: 32.8 pg (ref 26.0–34.0)
MCHC: 33.9 g/dL (ref 30.0–36.0)
MCV: 96.6 fL (ref 80.0–100.0)
Platelets: 150 10*3/uL (ref 150–400)
RBC: 2.9 MIL/uL — ABNORMAL LOW (ref 4.22–5.81)
RDW: 13.3 % (ref 11.5–15.5)
WBC: 6.8 10*3/uL (ref 4.0–10.5)
nRBC: 0 % (ref 0.0–0.2)

## 2022-08-17 LAB — TYPE AND SCREEN
ABO/RH(D): A NEG
Antibody Screen: NEGATIVE

## 2022-08-17 LAB — CK: Total CK: 433 U/L — ABNORMAL HIGH (ref 49–397)

## 2022-08-17 MED ORDER — ACETAMINOPHEN 500 MG PO TABS
500.0000 mg | ORAL_TABLET | Freq: Four times a day (QID) | ORAL | Status: DC | PRN
  Administered 2022-08-17: 500 mg via ORAL
  Filled 2022-08-17: qty 1

## 2022-08-17 MED ORDER — MAGNESIUM OXIDE -MG SUPPLEMENT 400 (240 MG) MG PO TABS
400.0000 mg | ORAL_TABLET | Freq: Every day | ORAL | Status: DC
  Administered 2022-08-17 – 2022-08-23 (×7): 400 mg via ORAL
  Filled 2022-08-17 (×7): qty 1

## 2022-08-17 MED ORDER — DOCUSATE SODIUM 100 MG PO CAPS
100.0000 mg | ORAL_CAPSULE | Freq: Two times a day (BID) | ORAL | Status: DC
  Administered 2022-08-18 – 2022-08-23 (×11): 100 mg via ORAL
  Filled 2022-08-17 (×12): qty 1

## 2022-08-17 MED ORDER — SULFAMETHOXAZOLE-TRIMETHOPRIM 800-160 MG PO TABS
1.0000 | ORAL_TABLET | ORAL | Status: DC
  Administered 2022-08-17 – 2022-08-22 (×3): 1 via ORAL
  Filled 2022-08-17 (×3): qty 1

## 2022-08-17 MED ORDER — AMLODIPINE BESYLATE 5 MG PO TABS
5.0000 mg | ORAL_TABLET | Freq: Every day | ORAL | Status: DC
  Administered 2022-08-17 – 2022-08-23 (×7): 5 mg via ORAL
  Filled 2022-08-17 (×7): qty 1

## 2022-08-17 MED ORDER — ACETAMINOPHEN 650 MG RE SUPP: 650.0000 mg | Freq: Four times a day (QID) | RECTAL | Status: DC | PRN

## 2022-08-17 MED ORDER — TENOFOVIR ALAFENAMIDE FUMARATE 25 MG PO TABS
25.0000 mg | ORAL_TABLET | Freq: Every day | ORAL | Status: DC
  Administered 2022-08-17 – 2022-08-23 (×7): 25 mg via ORAL
  Filled 2022-08-17 (×6): qty 1

## 2022-08-17 MED ORDER — ABACAVIR-DOLUTEGRAVIR-LAMIVUD 600-50-300 MG PO TABS
1.0000 | ORAL_TABLET | Freq: Every day | ORAL | Status: DC
  Administered 2022-08-18 – 2022-08-23 (×6): 1 via ORAL
  Filled 2022-08-17 (×8): qty 1

## 2022-08-17 MED ORDER — VITAMIN D 25 MCG (1000 UNIT) PO TABS
2000.0000 [IU] | ORAL_TABLET | Freq: Every day | ORAL | Status: DC
  Administered 2022-08-17 – 2022-08-23 (×7): 2000 [IU] via ORAL
  Filled 2022-08-17 (×7): qty 2

## 2022-08-17 MED ORDER — ACETAMINOPHEN 325 MG PO TABS
650.0000 mg | ORAL_TABLET | Freq: Four times a day (QID) | ORAL | Status: DC | PRN
  Administered 2022-08-17: 650 mg via ORAL
  Filled 2022-08-17: qty 2

## 2022-08-17 MED ORDER — ONDANSETRON HCL 4 MG/2ML IJ SOLN: 4.0000 mg | Freq: Four times a day (QID) | INTRAMUSCULAR | Status: DC | PRN

## 2022-08-17 MED ORDER — TRAMADOL HCL 50 MG PO TABS: 50.0000 mg | ORAL_TABLET | Freq: Four times a day (QID) | ORAL | Status: DC | PRN

## 2022-08-17 MED ORDER — METHOCARBAMOL 500 MG PO TABS: 500.0000 mg | ORAL_TABLET | Freq: Four times a day (QID) | ORAL | Status: DC | PRN

## 2022-08-17 MED ORDER — POTASSIUM CHLORIDE CRYS ER 20 MEQ PO TBCR
40.0000 meq | EXTENDED_RELEASE_TABLET | Freq: Once | ORAL | Status: AC
  Administered 2022-08-17: 40 meq via ORAL
  Filled 2022-08-17: qty 2

## 2022-08-17 MED ORDER — ONDANSETRON HCL 4 MG PO TABS: 4.0000 mg | ORAL_TABLET | Freq: Four times a day (QID) | ORAL | Status: DC | PRN
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
## 2022-08-18 DIAGNOSIS — I10 Essential (primary) hypertension: Secondary | ICD-10-CM | POA: Diagnosis not present

## 2022-08-18 DIAGNOSIS — N179 Acute kidney failure, unspecified: Secondary | ICD-10-CM

## 2022-08-18 DIAGNOSIS — W19XXXA Unspecified fall, initial encounter: Secondary | ICD-10-CM | POA: Diagnosis not present

## 2022-08-18 DIAGNOSIS — S42302A Unspecified fracture of shaft of humerus, left arm, initial encounter for closed fracture: Secondary | ICD-10-CM | POA: Diagnosis present

## 2022-08-18 DIAGNOSIS — Y92009 Unspecified place in unspecified non-institutional (private) residence as the place of occurrence of the external cause: Secondary | ICD-10-CM

## 2022-08-18 DIAGNOSIS — B20 Human immunodeficiency virus [HIV] disease: Secondary | ICD-10-CM

## 2022-08-18 LAB — COMPREHENSIVE METABOLIC PANEL
ALT: 52 U/L — ABNORMAL HIGH (ref 0–44)
AST: 53 U/L — ABNORMAL HIGH (ref 15–41)
Albumin: 3.1 g/dL — ABNORMAL LOW (ref 3.5–5.0)
Alkaline Phosphatase: 64 U/L (ref 38–126)
Anion gap: 4 — ABNORMAL LOW (ref 5–15)
BUN: 23 mg/dL (ref 8–23)
CO2: 29 mmol/L (ref 22–32)
Calcium: 8.2 mg/dL — ABNORMAL LOW (ref 8.9–10.3)
Chloride: 107 mmol/L (ref 98–111)
Creatinine, Ser: 1.1 mg/dL (ref 0.61–1.24)
GFR, Estimated: >60 mL/min (ref >60)
Glucose, Bld: 112 mg/dL — ABNORMAL HIGH (ref 70–99)
Potassium: 4.5 mmol/L (ref 3.5–5.1)
Sodium: 140 mmol/L (ref 135–145)
Total Bilirubin: 0.4 mg/dL (ref 0.3–1.2)
Total Protein: 6 g/dL — ABNORMAL LOW (ref 6.5–8.1)

## 2022-08-18 LAB — CBC
HCT: 27.2 % — ABNORMAL LOW (ref 39.0–52.0)
Hemoglobin: 9 g/dL — ABNORMAL LOW (ref 13.0–17.0)
MCH: 32.4 pg (ref 26.0–34.0)
MCHC: 33.1 g/dL (ref 30.0–36.0)
MCV: 97.8 fL (ref 80.0–100.0)
Platelets: 146 10*3/uL — ABNORMAL LOW (ref 150–400)
RBC: 2.78 MIL/uL — ABNORMAL LOW (ref 4.22–5.81)
RDW: 12.7 % (ref 11.5–15.5)
WBC: 7.3 10*3/uL (ref 4.0–10.5)
nRBC: 0 % (ref 0.0–0.2)

## 2022-08-18 LAB — VITAMIN D 25 HYDROXY (VIT D DEFICIENCY, FRACTURES): Vit D, 25-Hydroxy: 20.96 ng/mL — ABNORMAL LOW (ref 30–100)

## 2022-08-18 MED ORDER — HYDROMORPHONE HCL 1 MG/ML IJ SOLN
0.5000 mg | Freq: Once | INTRAMUSCULAR | Status: AC
  Administered 2022-08-18: 0.5 mg via INTRAVENOUS

## 2022-08-18 MED ORDER — HYDROMORPHONE HCL 2 MG PO TABS
1.0000 mg | ORAL_TABLET | ORAL | Status: DC | PRN
  Filled 2022-08-18: qty 1

## 2022-08-18 MED ORDER — HYDROMORPHONE HCL 2 MG PO TABS
2.0000 mg | ORAL_TABLET | ORAL | Status: DC | PRN
  Administered 2022-08-18 – 2022-08-23 (×13): 2 mg via ORAL
  Filled 2022-08-18 (×13): qty 1

## 2022-08-18 MED ORDER — LACTATED RINGERS IV SOLN: INTRAVENOUS | Status: AC

## 2022-08-18 MED ORDER — HYDROMORPHONE HCL 1 MG/ML IJ SOLN
0.5000 mg | Freq: Once | INTRAMUSCULAR | Status: AC
  Administered 2022-08-18: 0.5 mg via INTRAVENOUS
  Filled 2022-08-18: qty 0.5

## 2022-08-18 NOTE — Assessment & Plan Note (Signed)
Continue home regimen of HAART therapy (Triumeq, Tenofovir) Continue home regimen of Bactrim

## 2022-08-18 NOTE — Evaluation (Signed)
Physical Therapy Evaluation Patient Details Name: Jimmy Conner MRN: [id number] DOB: [birth year] Today's Date: 08/18/2022  History of Present Illness  Patient is 76 y.o. male presenting to [facility] ED on 12/14 secondary to a fall, found to have a comminuted L humerus fx.  PMH: prostate cancer, HTN, HIV, OA, anemia, colon cancer, L-THA 12/2019 with revision 02/2020.   Clinical Impression  Pt admitted with above problems and currently with functional limitations due to the deficits listed below (see PT Problem List). Pt lives alone and has family who can help PRN, including grandson and ex-wife, but does not have 24/7. Pt required min guard for transfers and short-distance ambulation in room with quad cane, pt became dizzy, BP in sitting 129/82, HR108, pain "off the charts." Pt will benefit from skilled PT to increase their independence and safety with mobility to allow discharge to the venue listed below.       Recommendations for follow up therapy are one component of a multi-disciplinary discharge planning process, led by the attending physician.  Recommendations may be updated based on patient status, additional functional criteria and insurance authorization.  Follow Up Recommendations Skilled nursing-short term rehab (<3 hours/day) Can patient physically be transported by private vehicle: Yes    Assistance Recommended at Discharge Frequent or constant Supervision/Assistance  Patient can return home with the following  A little help with walking and/or transfers;A lot of help with bathing/dressing/bathroom;Assistance with cooking/housework;Assist for transportation;Help with stairs or ramp for entrance    Equipment Recommendations None recommended by PT (TBD at SNF)  Recommendations for Other Services       Functional Status Assessment Patient has had a recent decline in their functional status and demonstrates the ability to make significant improvements in function in a reasonable and  predictable amount of time.     Precautions / Restrictions Precautions Precautions: Fall Precaution Comments: recent fall Restrictions Weight Bearing Restrictions: Yes LUE Weight Bearing: Non weight bearing      Mobility  Bed Mobility               General bed mobility comments: Pt in recliner at entry and exit    Transfers Overall transfer level: Needs assistance Equipment used: Quad cane Transfers: Sit to/from Stand Sit to Stand: Min guard           General transfer comment: Min guard for safety.    Ambulation/Gait Ambulation/Gait assistance: Min guard Gait Distance (Feet): 10 Feet Assistive device: Quad cane Gait Pattern/deviations: Step-to pattern, Decreased step length - left, Decreased stance time - left, Decreased dorsiflexion - right, Decreased dorsiflexion - left, Decreased weight shift to left Gait velocity: decreased     General Gait Details: Pt ambulated with quad cane, antalgic gait pattern, decreased stance time on the L, limited DF. No overt LOb noted or physical assist required  Stairs            Wheelchair Mobility    Modified Rankin (Stroke Patients Only)       Balance Overall balance assessment: Needs assistance Sitting-balance support: Feet supported, No upper extremity supported Sitting balance-Leahy Scale: Good     Standing balance support: Reliant on assistive device for balance, During functional activity, Single extremity supported Standing balance-Leahy Scale: Poor                               Pertinent Vitals/Pain Pain Assessment Pain Assessment: 0-10 Pain Score: 10-Worst pain ever Pain Location: left shoulder,  left hip Pain Descriptors / Indicators: Discomfort, Grimacing, Guarding Pain Intervention(s): Limited activity within patient's tolerance, Monitored during session, Repositioned    Home Living Family/patient expects to be discharged to:: Private residence Living Arrangements:  Alone Available Help at Discharge: Family;Available PRN/intermittently (Grandson) Type of Home: Apartment Home Access: Level entry       Home Layout: One level Home Equipment: Rollator (4 wheels);BSC/3in1;Grab bars - tub/shower;Cane - single point;Grab bars - toilet;Shower seat      Prior Function Prior Level of Function : Independent/Modified Independent;History of Falls (last six months)             Mobility Comments: RW ADLs Comments: IND     Hand Dominance   Dominant Hand: Right    Extremity/Trunk Assessment   Upper Extremity Assessment Upper Extremity Assessment: Defer to OT evaluation    Lower Extremity Assessment Lower Extremity Assessment: RLE deficits/detail;LLE deficits/detail RLE Deficits / Details: MMT knee/ankle 5/5, hip not tested RLE Sensation: WNL LLE Deficits / Details: MMT knee/ankle 5/5, hip not tested secondary to pain LLE Sensation: WNL    Cervical / Trunk Assessment Cervical / Trunk Assessment: Normal  Communication   Communication: No difficulties  Cognition Arousal/Alertness: Awake/alert Behavior During Therapy: WFL for tasks assessed/performed Overall Cognitive Status: Within Functional Limits for tasks assessed                                          General Comments General comments (skin integrity, edema, etc.): ex-wife Nadine present    Exercises General Exercises - Lower Extremity Ankle Circles/Pumps: AROM, Both, 10 reps   Assessment/Plan    PT Assessment Patient needs continued PT services  PT Problem List Decreased strength;Decreased range of motion;Decreased activity tolerance;Decreased balance;Decreased mobility;Decreased coordination;Pain       PT Treatment Interventions DME instruction;Gait training;Stair training;Functional mobility training;Therapeutic activities;Therapeutic exercise;Balance training;Patient/family education;Neuromuscular re-education    PT Goals (Current goals can be found in  the Care Plan section)  Acute Rehab PT Goals Patient Stated Goal: to get stronger PT Goal Formulation: With patient Time For Goal Achievement: 09/01/22 Potential to Achieve Goals: Good    Frequency Min 3X/week     Co-evaluation               AM-PAC PT "6 Clicks" Mobility  Outcome Measure Help needed turning from your back to your side while in a flat bed without using bedrails?: A Little Help needed moving from lying on your back to sitting on the side of a flat bed without using bedrails?: A Little Help needed moving to and from a bed to a chair (including a wheelchair)?: A Little Help needed standing up from a chair using your arms (e.g., wheelchair or bedside chair)?: A Little Help needed to walk in hospital room?: A Little Help needed climbing 3-5 steps with a railing? : A Lot 6 Click Score: 17    End of Session Equipment Utilized During Treatment: Gait belt Activity Tolerance: Patient limited by pain Patient left: in chair;with call bell/phone within reach;with chair alarm set;with family/visitor present Nurse Communication: Mobility status PT Visit Diagnosis: Pain;Difficulty in walking, not elsewhere classified (R26.2) Pain - Right/Left: Left Pain - part of body: Hip;Shoulder    Time: 1455-1519 PT Time Calculation (min) (ACUTE ONLY): 24 min   Charges:   PT Evaluation $PT Eval Low Complexity: 1 Low PT Treatments $Gait Training: 8-22 mins  Coolidge Breeze, PT, DPT [facility] Office: [phone] Weekend pager: [phone]  Coolidge Breeze 08/18/2022, 3:22 PM

## 2022-08-18 NOTE — Assessment & Plan Note (Signed)
Patient reports mechanical fall at home while using his walker resulting in an unfortunate complicated left humerus fracture This is in the setting of progressive generalized weakness and worsening debility Fall is seemingly mechanical without medical etiology No evidence of arrhythmia on EKG or telemetry Blood pressures unremarkable.  No clinical evidence of infection. PT/OT evaluations suggest that patient would benefit from skilled physical therapy services in a skilled nursing facility

## 2022-08-18 NOTE — Plan of Care (Signed)
Patient arrives to floor, stands and transfers to bed with assistance.  Levt arm in cast shoulder and ribs very tender.pain/   patient changed and turned, shoulder pain aggravated to a 8/10.  Np contacted and dilaudid administered

## 2022-08-18 NOTE — Hospital Course (Addendum)
76 y.o. male with medical history significant of osteopenia, HIV, hypertension, history of cervical vertebral fracture (in late childhood), prostate cancer who had a fall 2 days ago, seen at [place] encounters billing discharge.  He saw Dr. Stann Mainland from [facility] who referred him to the emergency department to be admitted due to the patient's inability to be in his apartment by himself.   Patient was identified to have a spiral fracture of the left humerus as well as acute kidney injury.  EDP discussed case with orthopedic surgery who stated that Dr. Stann Mainland would see the patient in the morning for consultation.  The hospitalist group was then called to assess the patient for admission to the hospital.

## 2022-08-18 NOTE — Progress Notes (Signed)
The patient was evaluated in our office by Dr. Stann Mainland for a comminuted left humerus fracture.  It was noted he is having significant difficulty with ADLs with no help at home.  Is recommended due to poor bone quality and comorbidities that he undergo conservative treatment remain in his emergency room splint for the first 2 weeks following his injury and then transition to a Sarmiento brace in the office with follow-up in the office in 2 weeks.  Ultimately We recommended PT evaluation and hopeful discharge to a SNF as it is unsafe for him to be at home by self and he has minimal help with ADLs.

## 2022-08-18 NOTE — Evaluation (Signed)
Occupational Therapy Evaluation Patient Details Name: Jimmy Conner MRN: [id number] DOB: 03-Jan-1946 Today's Date: 08/18/2022   History of Present Illness Patient is 76 y.o. male presenting to [facility] ED on 12/14 secondary to a fall, found to have a comminuted L humerus fx.  PMH: prostate cancer, HTN, HIV, OA, anemia, colon cancer, L-THA 12/2019 with revision 02/2020.   Clinical Impression   Mr. Jimmy Conner is a 76 year old man who presents with significant pain and non functional use of LUE resulting in a sudden inability to ambulate independently, perform Adls or manage IADLs. Patient typically independent with walker and loves to cook. Today patient reports 10/10 in LUE with any movement, pain in left hip with ambulation. Patient max assist to transfer to edge of bed, min assist to stand and min guard to ambulate with quad cane. Patient unable to make it back to room after ambulating 20 feet due to pain in left hip and decreased activity tolerance therefore needing recliner to be fetched. Patient max assist for UB ADLs and mod assist for LB ADLs. Patient will benefit from skilled OT services while in hospital to improve deficits and learn compensatory strategies as needed in order to return to PLOF.  Patient unable to care for himself. Recommend SNF at discharge.      Recommendations for follow up therapy are one component of a multi-disciplinary discharge planning process, led by the attending physician.  Recommendations may be updated based on patient status, additional functional criteria and insurance authorization.   Follow Up Recommendations  Skilled nursing-short term rehab (<3 hours/day)     Assistance Recommended at Discharge Frequent or constant Supervision/Assistance  Patient can return home with the following A little help with walking and/or transfers;A lot of help with bathing/dressing/bathroom;Assistance with cooking/housework;Help with stairs or ramp for entrance    Functional Status  Assessment  Patient has had a recent decline in their functional status and demonstrates the ability to make significant improvements in function in a reasonable and predictable amount of time.  Equipment Recommendations  Other (comment) (TBD)    Recommendations for Other Services       Precautions / Restrictions Precautions Precautions: Fall Precaution Comments: recent fall Required Braces or Orthoses: Splint/Cast Splint/Cast: splint from upper arm to wrist Splint/Cast - Date Prophylactic Dressing Applied (if applicable): 76/54/65 (at [facility]) Restrictions Weight Bearing Restrictions: Yes LUE Weight Bearing: Non weight bearing Other Position/Activity Restrictions: No formal orders - but typical for this time of fracture.      Mobility Bed Mobility Overal bed mobility: Needs Assistance Bed Mobility: Supine to Sit     Supine to sit: Max assist, +2 for safety/equipment     General bed mobility comments: Max assist for supine to sit    Transfers Overall transfer level: Needs assistance   Transfers: Sit to/from Stand Sit to Stand: Min assist           General transfer comment: Min assist to stand and at times to steady with ambulation in hall and with use of quad cane. In hall patient reports weakness and pain in left hip as well and needed recliner to be fetched.      Balance Overall balance assessment: Needs assistance Sitting-balance support: No upper extremity supported, Feet supported Sitting balance-Leahy Scale: Good     Standing balance support: During functional activity, Reliant on assistive device for balance Standing balance-Leahy Scale: Poor  ADL either performed or assessed with clinical judgement   ADL Overall ADL's : Needs assistance/impaired Eating/Feeding: Set up;Sitting   Grooming: Set up;Sitting   Upper Body Bathing: Maximal assistance;Sitting   Lower Body Bathing: Maximal assistance;Sit  to/from stand   Upper Body Dressing : Maximal assistance;Sitting   Lower Body Dressing: Moderate assistance;Sit to/from stand Lower Body Dressing Details (indicate cue type and reason): able to assist with Right hand to don slip on shoes Toilet Transfer: Minimal assistance;Regular Toilet   Toileting- Clothing Manipulation and Hygiene: Sit to/from stand;Moderate assistance       Functional mobility during ADLs: Minimal assistance;Cane       Vision Patient Visual Report: No change from baseline       Perception     Praxis      Pertinent Vitals/Pain Pain Assessment Pain Assessment: 0-10 Pain Score: 10-Worst pain ever Pain Location: left shoulder, left hip Pain Descriptors / Indicators: Discomfort, Grimacing, Guarding Pain Intervention(s): Monitored during session, Patient requesting pain meds-RN notified     Hand Dominance Right   Extremity/Trunk Assessment Upper Extremity Assessment Upper Extremity Assessment: LUE deficits/detail;Difficult to assess due to impaired cognition LUE Deficits / Details: swelling in hand and fingers, LUE in splint from shoulder to wrist and sling to assist with positioning LUE: Unable to fully assess due to pain;Unable to fully assess due to immobilization   Lower Extremity Assessment Lower Extremity Assessment: Defer to PT evaluation RLE Deficits / Details: MMT knee/ankle 5/5, hip not tested RLE Sensation: WNL LLE Deficits / Details: MMT knee/ankle 5/5, hip not tested secondary to pain LLE Sensation: WNL   Cervical / Trunk Assessment Cervical / Trunk Assessment: Normal   Communication Communication Communication: No difficulties   Cognition Arousal/Alertness: Awake/alert Behavior During Therapy: WFL for tasks assessed/performed Overall Cognitive Status: Within Functional Limits for tasks assessed                                       General Comments  ex-wife Nadine present    Exercises     Shoulder  Instructions      Home Living Family/patient expects to be discharged to:: Skilled nursing facility Living Arrangements: Alone Available Help at Discharge: Family;Available PRN/intermittently (Grandson) Type of Home: Apartment Home Access: Level entry     Home Layout: One level     Bathroom Shower/Tub: Teacher, early years/pre: Standard Bathroom Accessibility: Yes   Home Equipment: Rollator (4 wheels);BSC/3in1;Grab bars - tub/shower;Cane - single point;Grab bars - toilet;Shower seat          Prior Functioning/Environment Prior Level of Function : Independent/Modified Independent;History of Falls (last six months)             Mobility Comments: RW ADLs Comments: IND        OT Problem List: Decreased strength;Decreased coordination;Decreased range of motion;Decreased activity tolerance;Impaired balance (sitting and/or standing);Decreased knowledge of use of DME or AE;Increased edema;Impaired UE functional use;Pain      OT Treatment/Interventions: Self-care/ADL training;Therapeutic exercise;DME and/or AE instruction;Balance training;Patient/family education;Therapeutic activities    OT Goals(Current goals can be found in the care plan section) Acute Rehab OT Goals Patient Stated Goal: less pain OT Goal Formulation: With patient Time For Goal Achievement: 09/01/22 Potential to Achieve Goals: Good  OT Frequency: Min 2X/week    Co-evaluation              AM-PAC OT "6 Clicks" Daily Activity  Outcome Measure Help from another person eating meals?: A Little Help from another person taking care of personal grooming?: A Little Help from another person toileting, which includes using toliet, bedpan, or urinal?: A Lot Help from another person bathing (including washing, rinsing, drying)?: A Lot Help from another person to put on and taking off regular upper body clothing?: A Lot Help from another person to put on and taking off regular lower body  clothing?: A Lot 6 Click Score: 14   End of Session Equipment Utilized During Treatment: Gait belt Nurse Communication: Mobility status;Patient requests pain meds  Activity Tolerance: Patient limited by pain Patient left: in chair;with call bell/phone within reach;with chair alarm set  OT Visit Diagnosis: Unsteadiness on feet (R26.81);Pain Pain - Right/Left: Left Pain - part of body: Shoulder;Arm;Hand                Time: 2122-4825 OT Time Calculation (min): 32 min Charges:  OT General Charges $OT Visit: 1 Visit OT Evaluation $OT Eval Low Complexity: 1 Low OT Treatments $Therapeutic Activity: 8-22 mins  Gustavo Lah, OTR/L [place]  Office [phone]   Lenward Chancellor 08/18/2022, 4:01 PM

## 2022-08-18 NOTE — Assessment & Plan Note (Signed)
Resolved

## 2022-08-18 NOTE — Assessment & Plan Note (Addendum)
Case reviewed with Jonelle Sidle, PA with orthopedic surgery Patient was seen by Dr. Stann Mainland in clinic prior to presenting here at the [facility] emergency department Per orthopedic surgery, fracture is extremely complicated in the setting of patient's substantial osteoporosis and patient would most likely benefit from a conservative approach They recommend to continue to keep the left arm in the splint that has been placed in the emergency department Patient is to follow-up with Dr. Stann Mainland in outpatient clinic in approximately 2 weeks when the patient will be transitioned over to a Sarmiento brace at that time In the meantime, providing patient with as needed opiate-based analgesics for substantial associated pain Obtaining vitamin D level PT/OT evaluation as above

## 2022-08-18 NOTE — Progress Notes (Signed)
PROGRESS NOTE   Jimmy Conner  AJG:[id number] DOB: 1946-02-24 DOA: 08/16/2022 PCP: [facility]   Date of Service: the patient was seen and examined on 08/18/2022  Brief Narrative:  76 y.o. male with medical history significant of osteopenia, HIV, hypertension, history of cervical vertebral fracture (in late childhood), prostate cancer who had a fall 2 days ago, seen at [place] encounters billing discharge.  He saw Dr. Stann Mainland from [facility] who referred him to the emergency department to be admitted due to the patient's inability to be in his apartment by himself.   Patient was identified to have a spiral fracture of the left humerus as well as acute kidney injury.  EDP discussed case with orthopedic surgery who stated that Dr. Stann Mainland would see the patient in the morning for consultation.  The hospitalist group was then called to assess the patient for admission to the hospital.   Assessment and Plan: * Fall at home, initial encounter Patient reports mechanical fall at home while using his walker resulting in an unfortunate complicated left humerus fracture This is in the setting of progressive generalized weakness and worsening debility Fall is seemingly mechanical without medical etiology No evidence of arrhythmia on EKG or telemetry Blood pressures unremarkable.  No clinical evidence of infection. PT/OT evaluations suggest that patient would benefit from skilled physical therapy services in a skilled nursing facility  Traumatic closed displaced fracture of shaft of left humerus, initial encounter Case reviewed with Jonelle Sidle, PA with orthopedic surgery Patient was seen by Dr. Stann Mainland in clinic prior to presenting here at the [facility] emergency department Per orthopedic surgery, fracture is extremely complicated in the setting of patient's substantial osteoporosis and patient would most likely benefit from a conservative approach They recommend to continue to  keep the left arm in the splint that has been placed in the emergency department Patient is to follow-up with Dr. Stann Mainland in outpatient clinic in approximately 2 weeks when the patient will be transitioned over to a Sarmiento brace at that time In the meantime, providing patient with as needed opiate-based analgesics for substantial associated pain Obtaining vitamin D level PT/OT evaluation as above  AKI (acute kidney injury) ([place]) Hydrating patient with intravenous isotonic fluids. Strict input and output monitoring Monitoring renal function and electrolytes with serial chemistries Avoiding nephrotoxic agents if at all possible   Essential hypertension Resume patients home regimen of oral antihypertensives Titrate antihypertensive regimen as necessary to achieve adequate BP control PRN intravenous antihypertensives for excessively elevated blood pressure   HIV (human immunodeficiency virus infection) ([place]) Continue home regimen of HAART therapy (Triumeq, Tenofovir) Continue home regimen of Bactrim    Subjective:  Patient is complaining of left arm pain.  Pain is sharp in quality, moderate intensity, worse with movement of the affected extremity.  Physical Exam:  Vitals:   08/18/22 0045 08/18/22 0437 08/18/22 0755 08/18/22 1046  BP: 129/61 119/81 139/70 128/82  Pulse: 90 (!) 105 86 95  Resp: '17 18 18 18  '$ Temp: 98.5 F (36.9 C) 99.1 F (37.3 C) 98.7 F (37.1 C) 98 F (36.7 C)  TempSrc: Oral Oral Oral Oral  SpO2: 95% 97% 95% 97%  Weight:      Height:        Constitutional: Awake alert and oriented x3, no associated distress.   Skin: no rashes, no lesions, good skin turgor noted. Eyes: Pupils are equally reactive to light.  No evidence of scleral icterus or conjunctival pallor.  ENMT: Moist mucous membranes  noted.  Posterior pharynx clear of any exudate or lesions.   Respiratory: clear to auscultation bilaterally, no wheezing, no crackles. Normal respiratory effort. No  accessory muscle use.  Cardiovascular: Regular rate and rhythm, no murmurs / rubs / gallops. No extremity edema. 2+ pedal pulses. No carotid bruits.  Abdomen: Abdomen is soft and nontender.  No evidence of intra-abdominal masses.  Positive bowel sounds noted in all quadrants.   Musculoskeletal: Left arm currently in a sling.  Pain with both passive and active range of motion.  No other deformities noted of the other extremities.  Data Reviewed:  I have personally reviewed and interpreted labs, imaging.  Significant findings are   CBC: Recent Labs  Lab 08/16/22 2222 08/17/22 0450 08/18/22 0638  WBC 8.2 6.8 7.3  HGB 10.6* 9.5* 9.0*  HCT 31.3* 28.0* 27.2*  MCV 95.4 96.6 97.8  PLT 192 150 878*   Basic Metabolic Panel: Recent Labs  Lab 08/16/22 2222 08/17/22 0450 08/18/22 0638  NA 142 144 140  K 3.4* 3.5 4.5  CL 108 109 107  CO2 '25 25 29  '$ GLUCOSE 111* 111* 112*  BUN 29* 23 23  CREATININE 1.72* 1.34* 1.10  CALCIUM 9.4 8.4* 8.2*   GFR: Estimated Creatinine Clearance: 63.2 mL/min (by C-G formula based on SCr of 1.1 mg/dL). Liver Function Tests: Recent Labs  Lab 08/18/22 0638  AST 53*  ALT 52*  ALKPHOS 64  BILITOT 0.4  PROT 6.0*  ALBUMIN 3.1*    Coagulation Profile: No results for input(s): "INR", "PROTIME" in the last 168 hours.   EKG/Telemetry: Personally reviewed.  Rhythm is normal sinus rhythm.   Code Status:  Full code.  Code status decision has been confirmed with: patient     Severity of Illness:  The appropriate patient status for this patient is INPATIENT. Inpatient status is judged to be reasonable and necessary in order to provide the required intensity of service to ensure the patient's safety. The patient's presenting symptoms, physical exam findings, and initial radiographic and laboratory data in the context of their chronic comorbidities is felt to place them at high risk for further clinical deterioration. Furthermore, it is not anticipated  that the patient will be medically stable for discharge from the hospital within 2 midnights of admission.   * I certify that at the point of admission it is my clinical judgment that the patient will require inpatient hospital care spanning beyond 2 midnights from the point of admission due to high intensity of service, high risk for further deterioration and high frequency of surveillance required.*  Time spent:  54 minutes  Author:  Vernelle Emerald MD  08/18/2022 6:46 PM

## 2022-08-19 DIAGNOSIS — W19XXXA Unspecified fall, initial encounter: Secondary | ICD-10-CM | POA: Diagnosis not present

## 2022-08-19 DIAGNOSIS — I10 Essential (primary) hypertension: Secondary | ICD-10-CM | POA: Diagnosis not present

## 2022-08-19 DIAGNOSIS — S42302A Unspecified fracture of shaft of humerus, left arm, initial encounter for closed fracture: Secondary | ICD-10-CM | POA: Diagnosis not present

## 2022-08-19 DIAGNOSIS — E559 Vitamin D deficiency, unspecified: Secondary | ICD-10-CM | POA: Diagnosis present

## 2022-08-19 DIAGNOSIS — N179 Acute kidney failure, unspecified: Secondary | ICD-10-CM | POA: Diagnosis not present

## 2022-08-19 LAB — CBC WITH DIFFERENTIAL/PLATELET
Abs Immature Granulocytes: 0.03 10*3/uL (ref 0.00–0.07)
Basophils Absolute: 0 10*3/uL (ref 0.0–0.1)
Basophils Relative: 0 %
Eosinophils Absolute: 0 10*3/uL (ref 0.0–0.5)
Eosinophils Relative: 0 %
HCT: 25.3 % — ABNORMAL LOW (ref 39.0–52.0)
Hemoglobin: 8.5 g/dL — ABNORMAL LOW (ref 13.0–17.0)
Immature Granulocytes: 0 %
Lymphocytes Relative: 11 %
Lymphs Abs: 0.8 10*3/uL (ref 0.7–4.0)
MCH: 32.6 pg (ref 26.0–34.0)
MCHC: 33.6 g/dL (ref 30.0–36.0)
MCV: 96.9 fL (ref 80.0–100.0)
Monocytes Absolute: 0.9 10*3/uL (ref 0.1–1.0)
Monocytes Relative: 12 %
Neutro Abs: 5.5 10*3/uL (ref 1.7–7.7)
Neutrophils Relative %: 77 %
Platelets: 149 10*3/uL — ABNORMAL LOW (ref 150–400)
RBC: 2.61 MIL/uL — ABNORMAL LOW (ref 4.22–5.81)
RDW: 12.7 % (ref 11.5–15.5)
WBC: 7.2 10*3/uL (ref 4.0–10.5)
nRBC: 0 % (ref 0.0–0.2)

## 2022-08-19 LAB — CK: Total CK: 253 U/L (ref 49–397)

## 2022-08-19 LAB — COMPREHENSIVE METABOLIC PANEL
ALT: 64 U/L — ABNORMAL HIGH (ref 0–44)
AST: 44 U/L — ABNORMAL HIGH (ref 15–41)
Albumin: 3 g/dL — ABNORMAL LOW (ref 3.5–5.0)
Alkaline Phosphatase: 69 U/L (ref 38–126)
Anion gap: 6 (ref 5–15)
BUN: 15 mg/dL (ref 8–23)
CO2: 28 mmol/L (ref 22–32)
Calcium: 8.1 mg/dL — ABNORMAL LOW (ref 8.9–10.3)
Chloride: 104 mmol/L (ref 98–111)
Creatinine, Ser: 0.91 mg/dL (ref 0.61–1.24)
GFR, Estimated: >60 mL/min (ref >60)
Glucose, Bld: 110 mg/dL — ABNORMAL HIGH (ref 70–99)
Potassium: 3.8 mmol/L (ref 3.5–5.1)
Sodium: 138 mmol/L (ref 135–145)
Total Bilirubin: 0.6 mg/dL (ref 0.3–1.2)
Total Protein: 5.8 g/dL — ABNORMAL LOW (ref 6.5–8.1)

## 2022-08-19 LAB — MAGNESIUM: Magnesium: 2.1 mg/dL (ref 1.7–2.4)

## 2022-08-19 MED ORDER — VITAMIN D (ERGOCALCIFEROL) 1.25 MG (50000 UNIT) PO CAPS: 50000.0000 [IU] | ORAL_CAPSULE | ORAL | Status: DC

## 2022-08-19 MED ORDER — VITAMIN D (ERGOCALCIFEROL) 1.25 MG (50000 UNIT) PO CAPS
50000.0000 [IU] | ORAL_CAPSULE | ORAL | Status: DC
  Administered 2022-08-20: 50000 [IU] via ORAL
  Filled 2022-08-19: qty 1

## 2022-08-19 NOTE — Progress Notes (Addendum)
PROGRESS NOTE   Jimmy Conner  FAO:[id number] DOB: 02/17/1946 DOA: 08/16/2022 PCP: [facility]   Date of Service: the patient was seen and examined on 08/19/2022  Brief Narrative:  76 y.o. male with medical history significant of osteopenia, HIV, hypertension, history of cervical vertebral fracture (in late childhood), prostate cancer who had a fall 2 days ago, seen at [place] encounters billing discharge.  He saw Dr. Stann Mainland from [facility] who referred him to the emergency department to be admitted due to the patient's inability to be in his apartment by himself.   Patient was identified to have a spiral fracture of the left humerus as well as acute kidney injury.  EDP discussed case with orthopedic surgery who stated that Dr. Stann Mainland would see the patient in the morning for consultation.  The hospitalist group was then called to assess the patient for admission to the hospital.   Assessment and Plan: * Fall at home, initial encounter Pain currently well-controlled No evidence of arrhythmia on EKG or telemetry Blood pressures unremarkable.  No clinical evidence of infection. PT/OT evaluations suggest that patient would benefit from skilled physical therapy services in a skilled nursing facility currently awaiting SNF placement.  Traumatic closed displaced fracture of shaft of left humerus, initial encounter Case reviewed with Jonelle Sidle, PA with orthopedic surgery on 12/16 Patient was seen by Dr. Stann Mainland in clinic prior to presenting here at the [facility] emergency department Per orthopedic surgery, fracture is extremely complicated in the setting of patient's substantial osteoporosis and patient would most likely benefit from a conservative approach Further recommendations, left arm is going to be kept in the splint placed in the emergency department Patient is to follow-up with Dr. Stann Mainland in outpatient clinic in approximately 2 weeks when the patient will be  transitioned over to a Sarmiento brace at that time In the meantime, providing patient with as needed opiate-based analgesics for substantial associated pain Remainder of assessment and plan as above  Vitamin D deficiency Vitamin D level found to be somewhat low at 20.96.  In the setting of acute fracture will initiate 50,000 units of vitamin D q. weekly.  AKI (acute kidney injury) ([place]) Resolved   Essential hypertension Blood pressure well-controlled on regimen of Norvasc PRN intravenous antihypertensives for excessively elevated blood pressure   HIV (human immunodeficiency virus infection) ([place]) Continue home regimen of HAART therapy (Triumeq, Tenofovir) Continue home regimen of Bactrim    Subjective:  Left arm pain has substantially improved since yesterday.  Pain is mild in intensity, sharp in quality, worse with movement of the affected extremity and improved with rest.  Physical Exam:  Vitals:   08/19/22 0625 08/19/22 1108 08/19/22 1305 08/19/22 2036  BP: 137/79 135/80 93/76 (!) 147/83  Pulse: 89  86 87  Resp: '20  16 16  '$ Temp: 98 F (36.7 C)  98.5 F (36.9 C) 97.8 F (36.6 C)  TempSrc: Oral  Oral Oral  SpO2: 94%  94% 95%  Weight:      Height:        Constitutional: Awake alert and oriented x3, no associated distress.   Skin: no rashes, no lesions, good skin turgor noted. Eyes: Pupils are equally reactive to light.  No evidence of scleral icterus or conjunctival pallor.  ENMT: Moist mucous membranes noted.  Posterior pharynx clear of any exudate or lesions.   Respiratory: clear to auscultation bilaterally, no wheezing, no crackles. Normal respiratory effort. No accessory muscle use.  Cardiovascular: Regular rate and rhythm,  no murmurs / rubs / gallops. No extremity edema. 2+ pedal pulses. No carotid bruits.  Abdomen: Abdomen is soft and nontender.  No evidence of intra-abdominal masses.  Positive bowel sounds noted in all quadrants.   Musculoskeletal: Left arm  currently in a sling.  Pain with both passive and active range of motion.  No other deformities noted of the other extremities.  Data Reviewed:  I have personally reviewed and interpreted labs, imaging.  Significant findings are   CBC: Recent Labs  Lab 08/16/22 2222 08/17/22 0450 08/18/22 0638 08/19/22 0612  WBC 8.2 6.8 7.3 7.2  NEUTROABS  --   --   --  5.5  HGB 10.6* 9.5* 9.0* 8.5*  HCT 31.3* 28.0* 27.2* 25.3*  MCV 95.4 96.6 97.8 96.9  PLT 192 150 146* 324*   Basic Metabolic Panel: Recent Labs  Lab 08/16/22 2222 08/17/22 0450 08/18/22 0638 08/19/22 0612  NA 142 144 140 138  K 3.4* 3.5 4.5 3.8  CL 108 109 107 104  CO2 '25 25 29 28  '$ GLUCOSE 111* 111* 112* 110*  BUN 29* '23 23 15  '$ CREATININE 1.72* 1.34* 1.10 0.91  CALCIUM 9.4 8.4* 8.2* 8.1*  MG  --   --   --  2.1   GFR: Estimated Creatinine Clearance: 76.4 mL/min (by C-G formula based on SCr of 0.91 mg/dL). Liver Function Tests: Recent Labs  Lab 08/18/22 0638 08/19/22 0612  AST 53* 44*  ALT 52* 64*  ALKPHOS 64 69  BILITOT 0.4 0.6  PROT 6.0* 5.8*  ALBUMIN 3.1* 3.0*    Coagulation Profile: No results for input(s): "INR", "PROTIME" in the last 168 hours.   EKG/Telemetry: Personally reviewed.  Rhythm is normal sinus rhythm.   Code Status:  Full code.  Code status decision has been confirmed with: patient     Severity of Illness:  The appropriate patient status for this patient is INPATIENT. Inpatient status is judged to be reasonable and necessary in order to provide the required intensity of service to ensure the patient's safety. The patient's presenting symptoms, physical exam findings, and initial radiographic and laboratory data in the context of their chronic comorbidities is felt to place them at high risk for further clinical deterioration. Furthermore, it is not anticipated that the patient will be medically stable for discharge from the hospital within 2 midnights of admission.   * I certify that  at the point of admission it is my clinical judgment that the patient will require inpatient hospital care spanning beyond 2 midnights from the point of admission due to high intensity of service, high risk for further deterioration and high frequency of surveillance required.*  Time spent:  35 minutes  Author:  Vernelle Emerald MD  08/19/2022 11:03 PM

## 2022-08-19 NOTE — Assessment & Plan Note (Signed)
Vitamin D level found to be somewhat low at 20.96.  In the setting of acute fracture will initiate 50,000 units of vitamin D q. weekly.

## 2022-08-20 DIAGNOSIS — S42302A Unspecified fracture of shaft of humerus, left arm, initial encounter for closed fracture: Secondary | ICD-10-CM | POA: Diagnosis not present

## 2022-08-20 DIAGNOSIS — E559 Vitamin D deficiency, unspecified: Secondary | ICD-10-CM | POA: Diagnosis not present

## 2022-08-20 DIAGNOSIS — W19XXXA Unspecified fall, initial encounter: Secondary | ICD-10-CM | POA: Diagnosis not present

## 2022-08-20 DIAGNOSIS — N179 Acute kidney failure, unspecified: Secondary | ICD-10-CM | POA: Diagnosis not present

## 2022-08-20 LAB — CBC
HCT: 26.9 % — ABNORMAL LOW (ref 39.0–52.0)
Hemoglobin: 9.1 g/dL — ABNORMAL LOW (ref 13.0–17.0)
MCH: 32.4 pg (ref 26.0–34.0)
MCHC: 33.8 g/dL (ref 30.0–36.0)
MCV: 95.7 fL (ref 80.0–100.0)
Platelets: 202 10*3/uL (ref 150–400)
RBC: 2.81 MIL/uL — ABNORMAL LOW (ref 4.22–5.81)
RDW: 12.5 % (ref 11.5–15.5)
WBC: 7.1 10*3/uL (ref 4.0–10.5)
nRBC: 0 % (ref 0.0–0.2)

## 2022-08-20 NOTE — TOC PASRR Note (Addendum)
[place] Note  Patient Details  Name: Jimmy Conner Date of Birth: 1945/11/25  Transition of Care [facility]) CM/SW Contact:    Sherie Don, LCSW Phone Number: 08/20/2022, 1:28 PM  To Whom It May Concern:  Please be advised that this patient will require a short-term nursing home stay - anticipated 30 days or less for rehabilitation and strengthening. The plan is for return home.

## 2022-08-20 NOTE — NC FL2 (Signed)
[place] LEVEL OF CARE FORM     IDENTIFICATION  Patient Name: Jimmy Conner Birthdate: [birth year] Sex: male Admission Date (Current Location): 08/16/2022  [facility] and [place] Number:  Herbalist and Address:  [facility],  [place] [address]      Provider Number: [id number]  Attending Physician Name and Address:  Vernelle Emerald, MD  Relative Name and Phone Number:  Courtney Heys (spouse Ph: [phone]    Current Level of Care: Hospital Recommended Level of Care: [place] Prior Approval Number:    Date Approved/Denied:   PASRR Number: Pending  Discharge Plan: SNF    Current Diagnoses: Patient Active Problem List   Diagnosis Date Noted   Vitamin D deficiency 08/19/2022   Fall at home, initial encounter 08/18/2022   Traumatic closed displaced fracture of shaft of left humerus, initial encounter 08/18/2022   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   S/P left THA, 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   Family history of colon cancer    Family history of lung cancer    Malignant neoplasm of prostate ([place]) 10/15/2018   Status post placement of ureteral stent 03/03/2018   HIV (human immunodeficiency virus infection) ([place]) 03/03/2018   Essential hypertension 03/03/2018   AKI (acute kidney injury) ([place]) 03/03/2018   Personal history of other malignant neoplasm of skin 08/05/2014    Orientation RESPIRATION BLADDER Height & Weight     Self, Time, Situation, Place  Normal Incontinent Weight: 191 lb 9.3 oz (86.9 kg) Height:  '5\' 8"'$  (172.7 cm)  BEHAVIORAL SYMPTOMS/MOOD NEUROLOGICAL BOWEL NUTRITION STATUS   (N/A)  (N/A) Continent Diet (Heart healthy diet)  AMBULATORY STATUS COMMUNICATION OF NEEDS Skin   Limited Assist Verbally Other (Comment) (Erythema: sacrum)                       Personal Care Assistance Level of Assistance  Bathing, Feeding, Dressing Bathing Assistance: Maximum assistance Feeding assistance:  Independent Dressing Assistance: Maximum assistance     Functional Limitations Info  Sight, Hearing, Speech Sight Info: Impaired Hearing Info: Adequate Speech Info: Adequate    SPECIAL CARE FACTORS FREQUENCY  PT (By licensed PT), OT (By licensed OT)     PT Frequency: 5x's/week OT Frequency: 5x's/week            Contractures Contractures Info: Not present    Additional Factors Info  Code Status, Allergies Code Status Info: Full Allergies Info: Hydrocodone-acetaminophen, Oxycodone Hcl           Current Medications (08/20/2022):  This is the current hospital active medication list Current Facility-Administered Medications  Medication Dose Route Frequency Provider Last Rate Last Admin   abacavir-dolutegravir-lamiVUDine ([place]) 600-50-300 MG per tablet 1 tablet  1 tablet Oral Daily Reubin Milan, MD   1 tablet at 08/20/22 0948   acetaminophen (TYLENOL) tablet 650 mg  650 mg Oral Q6H PRN Reubin Milan, MD   650 mg at 08/17/22 1040   Or   acetaminophen (TYLENOL) suppository 650 mg  650 mg Rectal Q6H PRN Reubin Milan, MD       acetaminophen (TYLENOL) tablet 500 mg  500 mg Oral Q6H PRN Reubin Milan, MD   500 mg at 08/17/22 1704   amLODipine (NORVASC) tablet 5 mg  5 mg Oral Daily Reubin Milan, MD   5 mg at 08/20/22 0948   cholecalciferol (VITAMIN D3) 25 MCG (1000 UNIT) tablet 2,000 Units  2,000 Units Oral  Daily Reubin Milan, MD   2,000 Units at 08/20/22 1275   docusate sodium (COLACE) capsule 100 mg  100 mg Oral BID Reubin Milan, MD   100 mg at 08/20/22 1700   HYDROmorphone (DILAUDID) tablet 2 mg  2 mg Oral Q4H PRN Vernelle Emerald, MD   2 mg at 08/20/22 1258   Or   HYDROmorphone (DILAUDID) tablet 1 mg  1 mg Oral Q4H PRN Vernelle Emerald, MD       magnesium oxide (MAG-OX) tablet 400 mg  400 mg Oral Daily Reubin Milan, MD   400 mg at 08/20/22 0948   ondansetron (ZOFRAN) tablet 4 mg  4 mg Oral Q6H PRN Reubin Milan,  MD       Or   ondansetron [facility]) injection 4 mg  4 mg Intravenous Q6H PRN Reubin Milan, MD       sulfamethoxazole-trimethoprim (BACTRIM DS) 800-160 MG per tablet 1 tablet  1 tablet Oral Q M,W,F Reubin Milan, MD   1 tablet at 08/20/22 1749   Tenofovir Alafenamide Fumarate TABS 25 mg  25 mg Oral Daily Vernelle Emerald, MD   25 mg at 08/20/22 0948   Vitamin D (Ergocalciferol) (DRISDOL) 1.25 MG (50000 UNIT) capsule 50,000 Units  50,000 Units Oral Q7 days Vernelle Emerald, MD   50,000 Units at 08/20/22 4496     Discharge Medications: Please see discharge summary for a list of discharge medications.  Relevant Imaging Results:  Relevant Lab Results:   Additional Information SSN: [id number]  Sherie Don, LCSW

## 2022-08-20 NOTE — Progress Notes (Signed)
PROGRESS NOTE   Jimmy Conner  YIF:[id number] DOB: Aug 04, 1946 DOA: 08/16/2022 PCP: [facility]   Date of Service: the patient was seen and examined on 08/20/2022  Brief Narrative:  76 y.o. male with medical history significant of osteopenia, HIV, hypertension, history of cervical vertebral fracture (in late childhood), prostate cancer who had a fall 2 days ago, seen at [place] encounters billing discharge.  He saw Dr. Stann Mainland from [facility] who referred him to the emergency department to be admitted due to the patient's inability to be in his apartment by himself.   Patient was identified to have a spiral fracture of the left humerus as well as acute kidney injury.  EDP discussed case with orthopedic surgery who stated that Dr. Stann Mainland would see the patient in the morning for consultation.  The hospitalist group was then called to assess the patient for admission to the hospital.   Assessment and Plan: * Fall at home, initial encounter Pain currently well-controlled No evidence of arrhythmia on EKG or telemetry Blood pressures unremarkable.  No clinical evidence of infection. PT/OT evaluations suggest that patient would benefit from skilled physical therapy services in a skilled nursing facility currently awaiting SNF placement.  Traumatic closed displaced fracture of shaft of left humerus, initial encounter Case reviewed with Jonelle Sidle, PA with orthopedic surgery on 12/16 Patient was seen by Dr. Stann Mainland in clinic prior to presenting here at the [facility] emergency department Per orthopedic surgery, fracture is extremely complicated in the setting of patient's substantial osteoporosis and patient would most likely benefit from a conservative approach Further recommendations, left arm is going to be kept in the splint placed in the emergency department Patient is to follow-up with Dr. Stann Mainland in outpatient clinic in approximately 2 weeks when the patient will be  transitioned over to a Sarmiento brace at that time In the meantime, providing patient with as needed opiate-based analgesics for substantial associated pain Remainder of assessment and plan as above  Vitamin D deficiency Vitamin D level found to be somewhat low at 20.96.   Patient placed on 50,000 units of vitamin D q. weekly.  AKI (acute kidney injury) ([place]) Resolved   Essential hypertension Blood pressure well-controlled on regimen of Norvasc PRN intravenous antihypertensives for excessively elevated blood pressure   HIV (human immunodeficiency virus infection) ([place]) Continue home regimen of HAART therapy (Triumeq, Tenofovir) Continue home regimen of Bactrim    Subjective:  Patient continuing to complain of waxing and waning left arm pain, worse with movement and improved with rest.  Pain radiates distally.  Pain is improved compared to yesterday.   Physical Exam:  Vitals:   08/20/22 0551 08/20/22 0948 08/20/22 1218 08/20/22 2033  BP: (!) 124/90 125/86 123/76 (!) 140/79  Pulse: 90  95 99  Resp: '18  19 20  '$ Temp: 97.9 F (36.6 C)  98.3 F (36.8 C) 98.3 F (36.8 C)  TempSrc: Oral  Oral Oral  SpO2: 98%  97% 96%  Weight:      Height:        Constitutional: Awake alert and oriented x3, no associated distress.   Skin: no rashes, no lesions, good skin turgor noted. Eyes: Pupils are equally reactive to light.  No evidence of scleral icterus or conjunctival pallor.  ENMT: Moist mucous membranes noted.  Posterior pharynx clear of any exudate or lesions.   Respiratory: clear to auscultation bilaterally, no wheezing, no crackles. Normal respiratory effort. No accessory muscle use.  Cardiovascular: Regular rate and rhythm, no  murmurs / rubs / gallops.  Notable edema of the left hand unchanged from yesterday.  2+ pedal pulses. No carotid bruits.  Abdomen: Abdomen is soft and nontender.  No evidence of intra-abdominal masses.  Positive bowel sounds noted in all quadrants.    Musculoskeletal: Left arm currently in a sling.  Pain with both passive and active range of motion.  No other deformities noted of the other extremities.  Data Reviewed:  I have personally reviewed and interpreted labs, imaging.  Significant findings are   CBC: Recent Labs  Lab 08/16/22 2222 08/17/22 0450 08/18/22 0638 08/19/22 0612 08/20/22 1052  WBC 8.2 6.8 7.3 7.2 7.1  NEUTROABS  --   --   --  5.5  --   HGB 10.6* 9.5* 9.0* 8.5* 9.1*  HCT 31.3* 28.0* 27.2* 25.3* 26.9*  MCV 95.4 96.6 97.8 96.9 95.7  PLT 192 150 146* 149* 341   Basic Metabolic Panel: Recent Labs  Lab 08/16/22 2222 08/17/22 0450 08/18/22 0638 08/19/22 0612  NA 142 144 140 138  K 3.4* 3.5 4.5 3.8  CL 108 109 107 104  CO2 '25 25 29 28  '$ GLUCOSE 111* 111* 112* 110*  BUN 29* '23 23 15  '$ CREATININE 1.72* 1.34* 1.10 0.91  CALCIUM 9.4 8.4* 8.2* 8.1*  MG  --   --   --  2.1   GFR: Estimated Creatinine Clearance: 76.4 mL/min (by C-G formula based on SCr of 0.91 mg/dL). Liver Function Tests: Recent Labs  Lab 08/18/22 0638 08/19/22 0612  AST 53* 44*  ALT 52* 64*  ALKPHOS 64 69  BILITOT 0.4 0.6  PROT 6.0* 5.8*  ALBUMIN 3.1* 3.0*    Coagulation Profile: No results for input(s): "INR", "PROTIME" in the last 168 hours.   EKG/Telemetry: Personally reviewed.  Rhythm is normal sinus rhythm.   Code Status:  Full code.  Code status decision has been confirmed with: patient     Severity of Illness:  The appropriate patient status for this patient is INPATIENT. Inpatient status is judged to be reasonable and necessary in order to provide the required intensity of service to ensure the patient's safety. The patient's presenting symptoms, physical exam findings, and initial radiographic and laboratory data in the context of their chronic comorbidities is felt to place them at high risk for further clinical deterioration. Furthermore, it is not anticipated that the patient will be medically stable for discharge  from the hospital within 2 midnights of admission.   * I certify that at the point of admission it is my clinical judgment that the patient will require inpatient hospital care spanning beyond 2 midnights from the point of admission due to high intensity of service, high risk for further deterioration and high frequency of surveillance required.*  Time spent:  34 minutes  Author:  Vernelle Emerald MD  08/20/2022 10:13 PM

## 2022-08-20 NOTE — TOC Initial Note (Addendum)
Transition of Care [facility]) - Initial/Assessment Note   Patient Details  Name: Jimmy Conner MRN: [id number] Date of Birth: [birth year]  Transition of Care [facility]) CM/SW Contact:    Sherie Don, LCSW Phone Number: 08/20/2022, 12:58 PM  Clinical Narrative: PT evaluation recommended SNF and patient is agreeable to rehab. Patient is aware he will go under his Medicare benefits rather than his [place] insurance. FL2; PASRR pending. Requested documentation uploaded to [facility] MUST for review. Initial referral faxed out. TOC awaiting bed offers and PASRR number.  Expected Discharge Plan: Skilled Nursing Facility Barriers to Discharge: Continued Medical Work up, SNF Pending bed offer, Insurance Authorization  Patient Goals and CMS Choice Patient states their goals for this hospitalization and ongoing recovery are:: Go to short-term rehab before returning home CMS Medicare.gov Compare Post Acute Care list provided to:: Patient Choice offered to / list presented to : Patient  Expected Discharge Plan and Services Expected Discharge Plan: [place] In-house Referral: Clinical Social Work Post Acute Care Choice: [place] Living arrangements for the past 2 months: Apartment            DME Arranged: N/A DME Agency: NA  Prior Living Arrangements/Services Living arrangements for the past 2 months: Apartment Lives with:: Spouse Patient language and need for interpreter reviewed:: Yes Need for Family Participation in Patient Care: No (Comment) Care giver support system in place?: Yes (comment) Criminal Activity/Legal Involvement Pertinent to Current Situation/Hospitalization: No - Comment as needed  Activities of Daily Living Home Assistive Devices/Equipment: Environmental consultant (specify type), Cane (specify quad or straight) ADL Screening (condition at time of admission) Patient's cognitive ability adequate to safely complete daily activities?: Yes Is the patient deaf or have difficulty  hearing?: No Does the patient have difficulty seeing, even when wearing glasses/contacts?: No Does the patient have difficulty concentrating, remembering, or making decisions?: No Patient able to express need for assistance with ADLs?: Yes Does the patient have difficulty dressing or bathing?: Yes Independently performs ADLs?: No Communication: Independent Dressing (OT): Needs assistance Grooming: Independent Feeding: Independent Bathing: Needs assistance Toileting: Needs assistance In/Out Bed: Needs assistance Walks in Home: Needs assistance Does the patient have difficulty walking or climbing stairs?: Yes Weakness of Legs: Left Weakness of Arms/Hands: None  Permission Sought/Granted Permission sought to share information with : Facility Art therapist granted to share information with : Yes, Verbal Permission Granted Permission granted to share info w AGENCY: SNFs  Emotional Assessment Attitude/Demeanor/Rapport: Engaged Affect (typically observed): Accepting Orientation: : Oriented to Self, Oriented to Place, Oriented to  Time, Oriented to Situation Alcohol / Substance Use: Not Applicable Psych Involvement: No (comment)  Admission diagnosis:  Humerus fracture [S42.309A] AKI (acute kidney injury) ([place]) [N17.9] Intractable pain [R52] Closed displaced comminuted fracture of shaft of left humerus, initial encounter [S42.352A] Patient Active Problem List   Diagnosis Date Noted   Vitamin D deficiency 08/19/2022   Fall at home, initial encounter 08/18/2022   Traumatic closed displaced fracture of shaft of left humerus, initial encounter 08/18/2022   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 [place] - [address] [place] [address] Phone: [phone] Fax: [phone]  Social Determinants of Health (SDOH) Interventions    Readmission Risk Interventions     No data to display

## 2022-08-21 DIAGNOSIS — S42302A Unspecified fracture of shaft of humerus, left arm, initial encounter for closed fracture: Secondary | ICD-10-CM | POA: Diagnosis not present

## 2022-08-21 DIAGNOSIS — E559 Vitamin D deficiency, unspecified: Secondary | ICD-10-CM | POA: Diagnosis not present

## 2022-08-21 DIAGNOSIS — W19XXXA Unspecified fall, initial encounter: Secondary | ICD-10-CM | POA: Diagnosis not present

## 2022-08-21 DIAGNOSIS — N179 Acute kidney failure, unspecified: Secondary | ICD-10-CM | POA: Diagnosis not present

## 2022-08-21 MED ORDER — ORAL CARE MOUTH RINSE: 15.0000 mL | OROMUCOSAL | Status: DC | PRN

## 2022-08-21 NOTE — Progress Notes (Signed)
Physical Therapy Treatment Patient Details Name: Jimmy Conner MRN: [id number] DOB: [birth year] Today's Date: 08/21/2022   History of Present Illness Patient is 76 y.o. male presenting to [facility] ED on 12/14 secondary to a fall, found to have a comminuted L humerus fx.  PMH: prostate cancer, HTN, HIV, OA, anemia, colon cancer, L-THA 12/2019 with revision 02/2020.    PT Comments    General Comments: AxO x 3 very pleasant, motivated, talker, retired [employer] married to his HS sweetheart. Assisted OOB to amb in hallway.  General transfer comment: Mod/Min Assist to steady self from bed with slightly impaired balance.  L UE is a large/heavy/cast with sling.  "Throws me off balance" stated pt.  "Hard to do things with just one arm", stated pt. General Gait Details: tolerated an increased distance but Mod unsteady using small base [employer].  Decreased gait speed and short steps with extra cation/care with turns and back steps.  HIGH FALL RISK. Pt will need ST Rehab at SNF to address mobility and functional decline prior to safely returning home.   Recommendations for follow up therapy are one component of a multi-disciplinary discharge planning process, led by the attending physician.  Recommendations may be updated based on patient status, additional functional criteria and insurance authorization.  Follow Up Recommendations  Skilled nursing-short term rehab (<3 hours/day) Can patient physically be transported by private vehicle: Yes   Assistance Recommended at Discharge Frequent or constant Supervision/Assistance  Patient can return home with the following A little help with walking and/or transfers;A lot of help with bathing/dressing/bathroom;Assistance with cooking/housework;Assist for transportation;Help with stairs or ramp for entrance   Equipment Recommendations  None recommended by PT    Recommendations for Other Services       Precautions / Restrictions Precautions Precautions: Fall Precaution  Comments: recent fall Required Braces or Orthoses: Splint/Cast Splint/Cast: splint from upper arm to wrist Splint/Cast - Date Prophylactic Dressing Applied (if applicable): 35/45/62 Restrictions Weight Bearing Restrictions: Yes LUE Weight Bearing: Non weight bearing Other Position/Activity Restrictions: No formal orders - but typical for this time of fracture.     Mobility  Bed Mobility Overal bed mobility: Needs Assistance Bed Mobility: Supine to Sit, Sit to Supine     Supine to sit: Max assist Sit to supine: Max assist   General bed mobility comments: Max assist for upperbody    Transfers Overall transfer level: Needs assistance Equipment used: Quad cane Transfers: Sit to/from Stand Sit to Stand: Mod assist, Min assist           General transfer comment: Mod/Min Assist to steady self from bed with slightly impaired balance.  L UE is a large/heavy/cast with sling.  "Throws me off balance" stated pt.  "Hard to do things with just one arm", stated pt.    Ambulation/Gait Ambulation/Gait assistance: Min assist, Mod assist Gait Distance (Feet): 24 Feet Assistive device: Quad cane Gait Pattern/deviations: Step-to pattern, Decreased step length - left, Decreased stance time - left, Decreased dorsiflexion - right, Decreased dorsiflexion - left, Decreased weight shift to left Gait velocity: decreased     General Gait Details: tolerated an increased distance but Mod unsteady using small base [employer].  Decreased gait speed and short steps with extra cation/care with turns and back steps.  HIGH FALL RISK.   Stairs             Wheelchair Mobility    Modified Rankin (Stroke Patients Only)       Balance  Cognition Arousal/Alertness: Awake/alert Behavior During Therapy: WFL for tasks assessed/performed Overall Cognitive Status: Within Functional Limits for tasks assessed                                  General Comments: AxO x 3 very pleasant, motivated, talker, retired [employer] married to his HS sweetheart.        Exercises      General Comments        Pertinent Vitals/Pain Pain Assessment Pain Assessment: Faces Faces Pain Scale: Hurts a little bit Pain Location: left shoulder, left hip Pain Descriptors / Indicators: Discomfort, Grimacing, Guarding Pain Intervention(s): Monitored during session    Home Living                          Prior Function            PT Goals (current goals can now be found in the care plan section) Progress towards PT goals: Progressing toward goals    Frequency    Min 3X/week      PT Plan Current plan remains appropriate    Co-evaluation              AM-PAC PT "6 Clicks" Mobility   Outcome Measure  Help needed turning from your back to your side while in a flat bed without using bedrails?: A Little Help needed moving from lying on your back to sitting on the side of a flat bed without using bedrails?: A Little Help needed moving to and from a bed to a chair (including a wheelchair)?: A Little Help needed standing up from a chair using your arms (e.g., wheelchair or bedside chair)?: A Little Help needed to walk in hospital room?: A Lot Help needed climbing 3-5 steps with a railing? : Total 6 Click Score: 15    End of Session Equipment Utilized During Treatment: Gait belt Activity Tolerance: Patient tolerated treatment well;Patient limited by fatigue Patient left: in bed;with call bell/phone within reach;with bed alarm set Nurse Communication: Mobility status PT Visit Diagnosis: Pain;Difficulty in walking, not elsewhere classified (R26.2) Pain - Right/Left: Left Pain - part of body: Hip;Shoulder     Time: 1532-1550 PT Time Calculation (min) (ACUTE ONLY): 18 min  Charges:  $Gait Training: 8-22 mins                     Rica Koyanagi  PTA [place] Office M-F           [phone] Weekend pager [phone]

## 2022-08-21 NOTE — TOC Progression Note (Signed)
Transition of Care [facility]) - Progression Note    Patient Details  Name: Jimmy Conner MRN: [id number] Date of Birth: [birth year]  Transition [facility] Care [facility]) CM/SW Contact  Leeroy Cha, RN Phone Number: 08/21/2022, 10:39 AM  Clinical Narrative:    Information submitted to navihealth for approval of snf admission at 1040.   Expected Discharge Plan: [place] Barriers to Discharge: Continued Medical Work up, SNF Pending bed offer, Ship broker  Expected Discharge Plan and Services Expected Discharge Plan: [place] In-house Referral: Clinical Social Work   Post Acute Care Choice: [place] Living arrangements for the past 2 months: Apartment                 DME Arranged: N/A DME Agency: NA                   Social Determinants of Health (SDOH) Interventions    Readmission Risk Interventions   No data to display

## 2022-08-21 NOTE — Progress Notes (Signed)
PROGRESS NOTE   Jimmy Conner  UKG:[id number] DOB: 07-25-1946 DOA: 08/16/2022 PCP: [facility], [facility]   Date of Service: the patient was seen and examined on 08/21/2022  Brief Narrative:  76 y.o. male with medical history significant of osteopenia, HIV, hypertension, history of cervical vertebral fracture (in late childhood), prostate cancer who had a fall 2 days ago, seen at [place] encounters billing discharge.  He saw Dr. Stann Mainland from [facility] who referred him to the emergency department to be admitted due to the patient's inability to be in his apartment by himself.   Patient was identified to have a spiral fracture of the left humerus as well as acute kidney injury.   The hospitalist group was then called to assess the patient for admission to the hospital.  Upon admission to the hospital patient was hydrated with intravenous isotonic fluids resulting in resolution of the patient's acute kidney injury.  Case was discussed with orthopedic surgery and considering the complicated nature of the fracture in the setting of the patient's substantial osteoporosis conservative management of the fracture was recommended with outpatient follow-up with Dr. Stann Mainland in 2 weeks.  Patient underwent PT/OT evaluations.  Recommendation was for patient to go to a skilled nursing facility.  Patient is currently awaiting prior authorization.   Assessment and Plan: * Fall at home, initial encounter Pain currently well-controlled No evidence of arrhythmia on EKG or telemetry Blood pressures unremarkable.  No clinical evidence of infection. PT/OT evaluations suggest that patient would benefit from skilled physical therapy services in a skilled nursing facility currently awaiting SNF placement.  Traumatic closed displaced fracture of shaft of left humerus, initial encounter Case reviewed with Jonelle Sidle, PA with orthopedic surgery on 12/16 Patient was seen by Dr. Stann Mainland in clinic prior to presenting  here at the [facility] emergency department Per orthopedic surgery, fracture is extremely complicated in the setting of patient's substantial osteoporosis and patient would most likely benefit from a conservative approach Further recommendations, left arm is going to be kept in the splint placed in the emergency department Patient is to follow-up with Dr. Stann Mainland in outpatient clinic in approximately 2 weeks when the patient will be transitioned over to a [place] brace at that time In the meantime, providing patient with as needed opiate-based analgesics for substantial associated pain Remainder of assessment and plan as above  Vitamin D deficiency Vitamin D level found to be somewhat low at 20.96.   Patient placed on 50,000 units of vitamin D q. weekly.  AKI (acute kidney injury) ([place]) Resolved   Essential hypertension Blood pressure well-controlled on regimen of Norvasc PRN intravenous antihypertensives for excessively elevated blood pressure   HIV (human immunodeficiency virus infection) ([place]) Continue home regimen of HAART therapy (Triumeq, Tenofovir) Continue home regimen of Bactrim    Subjective:  Patient continuing to complain of waxing and waning left arm pain, worse with movement and improved with rest.  Pain radiates distally.  Pain is improved compared to yesterday.   Physical Exam:  Vitals:   08/20/22 2033 08/21/22 0536 08/21/22 1342 08/21/22 2029  BP: (!) 140/79 132/75 115/76 123/76  Pulse: 99 87 93 91  Resp: '20 18 20 19  '$ Temp: 98.3 F (36.8 C) 98.8 F (37.1 C) 98.4 F (36.9 C) 97.9 F (36.6 C)  TempSrc: Oral Oral Oral Oral  SpO2: 96% 95% 93% 95%  Weight:      Height:        Constitutional: Awake alert and oriented x3, no associated distress.  Skin: no rashes, no lesions, good skin turgor noted. Eyes: Pupils are equally reactive to light.  No evidence of scleral icterus or conjunctival pallor.  ENMT: Moist mucous membranes noted.  Posterior  pharynx clear of any exudate or lesions.   Respiratory: clear to auscultation bilaterally, no wheezing, no crackles. Normal respiratory effort. No accessory muscle use.  Cardiovascular: Regular rate and rhythm, no murmurs / rubs / gallops.  Notable edema of the left hand unchanged from yesterday.  2+ pedal pulses. No carotid bruits.  Abdomen: Abdomen is soft and nontender.  No evidence of intra-abdominal masses.  Positive bowel sounds noted in all quadrants.   Musculoskeletal: Left arm currently in a sling.  Pain with both passive and active range of motion.  No other deformities noted of the other extremities.  Data Reviewed:  I have personally reviewed and interpreted labs, imaging.  Significant findings are   CBC: Recent Labs  Lab 08/16/22 2222 08/17/22 0450 08/18/22 0638 08/19/22 0612 08/20/22 1052  WBC 8.2 6.8 7.3 7.2 7.1  NEUTROABS  --   --   --  5.5  --   HGB 10.6* 9.5* 9.0* 8.5* 9.1*  HCT 31.3* 28.0* 27.2* 25.3* 26.9*  MCV 95.4 96.6 97.8 96.9 95.7  PLT 192 150 146* 149* 711   Basic Metabolic Panel: Recent Labs  Lab 08/16/22 2222 08/17/22 0450 08/18/22 0638 08/19/22 0612  NA 142 144 140 138  K 3.4* 3.5 4.5 3.8  CL 108 109 107 104  CO2 '25 25 29 28  '$ GLUCOSE 111* 111* 112* 110*  BUN 29* '23 23 15  '$ CREATININE 1.72* 1.34* 1.10 0.91  CALCIUM 9.4 8.4* 8.2* 8.1*  MG  --   --   --  2.1   GFR: Estimated Creatinine Clearance: 76.4 mL/min (by C-G formula based on SCr of 0.91 mg/dL). Liver Function Tests: Recent Labs  Lab 08/18/22 0638 08/19/22 0612  AST 53* 44*  ALT 52* 64*  ALKPHOS 64 69  BILITOT 0.4 0.6  PROT 6.0* 5.8*  ALBUMIN 3.1* 3.0*    Coagulation Profile: No results for input(s): "INR", "PROTIME" in the last 168 hours.   EKG/Telemetry: Personally reviewed.  Rhythm is normal sinus rhythm.   Code Status:  Full code.  Code status decision has been confirmed with: patient     Severity of Illness:  The appropriate patient status for this patient is  INPATIENT. Inpatient status is judged to be reasonable and necessary in order to provide the required intensity of service to ensure the patient's safety. The patient's presenting symptoms, physical exam findings, and initial radiographic and laboratory data in the context of their chronic comorbidities is felt to place them at high risk for further clinical deterioration. Furthermore, it is not anticipated that the patient will be medically stable for discharge from the hospital within 2 midnights of admission.   * I certify that at the point of admission it is my clinical judgment that the patient will require inpatient hospital care spanning beyond 2 midnights from the point of admission due to high intensity of service, high risk for further deterioration and high frequency of surveillance required.*  Time spent:  34 minutes  Author:  Vernelle Emerald MD  08/21/2022 10:58 PM

## 2022-08-21 NOTE — Progress Notes (Signed)
Report received from M. Bullins RN; no change assessment. Will continue plan of care. Zaylen Susman, Laurel Dimmer, RN

## 2022-08-22 DIAGNOSIS — W19XXXA Unspecified fall, initial encounter: Secondary | ICD-10-CM | POA: Diagnosis not present

## 2022-08-22 DIAGNOSIS — I10 Essential (primary) hypertension: Secondary | ICD-10-CM | POA: Diagnosis not present

## 2022-08-22 DIAGNOSIS — Y92009 Unspecified place in unspecified non-institutional (private) residence as the place of occurrence of the external cause: Secondary | ICD-10-CM | POA: Diagnosis not present

## 2022-08-22 LAB — BASIC METABOLIC PANEL
Anion gap: 8 (ref 5–15)
BUN: 23 mg/dL (ref 8–23)
CO2: 26 mmol/L (ref 22–32)
Calcium: 8.2 mg/dL — ABNORMAL LOW (ref 8.9–10.3)
Chloride: 105 mmol/L (ref 98–111)
Creatinine, Ser: 1.06 mg/dL (ref 0.61–1.24)
GFR, Estimated: >60 mL/min (ref >60)
Glucose, Bld: 105 mg/dL — ABNORMAL HIGH (ref 70–99)
Potassium: 3.8 mmol/L (ref 3.5–5.1)
Sodium: 139 mmol/L (ref 135–145)

## 2022-08-22 LAB — CBC
HCT: 27 % — ABNORMAL LOW (ref 39.0–52.0)
Hemoglobin: 8.8 g/dL — ABNORMAL LOW (ref 13.0–17.0)
MCH: 31.8 pg (ref 26.0–34.0)
MCHC: 32.6 g/dL (ref 30.0–36.0)
MCV: 97.5 fL (ref 80.0–100.0)
Platelets: 232 10*3/uL (ref 150–400)
RBC: 2.77 MIL/uL — ABNORMAL LOW (ref 4.22–5.81)
RDW: 12.6 % (ref 11.5–15.5)
WBC: 6.4 10*3/uL (ref 4.0–10.5)
nRBC: 0 % (ref 0.0–0.2)

## 2022-08-22 LAB — MAGNESIUM: Magnesium: 2.6 mg/dL — ABNORMAL HIGH (ref 1.7–2.4)

## 2022-08-22 MED ORDER — HYDRALAZINE HCL 20 MG/ML IJ SOLN: 10.0000 mg | INTRAMUSCULAR | Status: DC | PRN

## 2022-08-22 MED ORDER — METOPROLOL TARTRATE 5 MG/5ML IV SOLN: 5.0000 mg | INTRAVENOUS | Status: DC | PRN

## 2022-08-22 MED ORDER — LIP MEDEX EX OINT
TOPICAL_OINTMENT | CUTANEOUS | Status: DC | PRN
  Filled 2022-08-22: qty 7

## 2022-08-22 MED ORDER — SENNOSIDES-DOCUSATE SODIUM 8.6-50 MG PO TABS: 1.0000 | ORAL_TABLET | Freq: Every evening | ORAL | Status: DC | PRN

## 2022-08-22 MED ORDER — TRAZODONE HCL 50 MG PO TABS: 50.0000 mg | ORAL_TABLET | Freq: Every evening | ORAL | Status: DC | PRN

## 2022-08-22 MED ORDER — IPRATROPIUM-ALBUTEROL 0.5-2.5 (3) MG/3ML IN SOLN: 3.0000 mL | RESPIRATORY_TRACT | Status: DC | PRN

## 2022-08-22 MED ORDER — GUAIFENESIN 100 MG/5ML PO LIQD: 5.0000 mL | ORAL | Status: DC | PRN

## 2022-08-22 NOTE — Progress Notes (Signed)
   Subjective:  Jimmy Conner is a 76 y.o. male,       Patient reports pain as mild to moderate.  Been in the bed, reports slight improvement in arm pain with time.  Feels like his sling is not supporting him.  Denies numbness or tingling.  Denies fever or chills.  Objective:   VITALS:   Vitals:   08/21/22 1342 08/21/22 2029 08/22/22 0611 08/22/22 1247  BP: 115/76 123/76 129/68 113/73  Pulse: 93 91 85 94  Resp: '20 19 18 18  '$ Temp: 98.4 F (36.9 C) 97.9 F (36.6 C) 98.6 F (37 C) (!) 97.4 F (36.3 C)  TempSrc: Oral Oral Oral Oral  SpO2: 93% 95% 98% 100%  Weight:      Height:       Laying in hospital bed no acute distress, sling by his side, splint on  Left upper extremity: Resolving ecchymosis of proximal arm, edema in the hand, neurovascular intact distally, able to make a full fist.  Capillary refill less than 3 seconds.  Coaptation splint with posterior slab present.  Lab Results  Component Value Date   WBC 6.4 08/22/2022   HGB 8.8 (L) 08/22/2022   HCT 27.0 (L) 08/22/2022   MCV 97.5 08/22/2022   PLT 232 08/22/2022   BMET    Component Value Date/Time   NA 139 08/22/2022 0838   K 3.8 08/22/2022 0838   CL 105 08/22/2022 0838   CO2 26 08/22/2022 0838   GLUCOSE 105 (H) 08/22/2022 0838   BUN 23 08/22/2022 0838   CREATININE 1.06 08/22/2022 0838   CALCIUM 8.2 (L) 08/22/2022 0838   GFRNONAA >60 08/22/2022 0998     Assessment/Plan:     Principal Problem:   Fall at home, initial encounter Active Problems:   HIV (human immunodeficiency virus infection) ([place])   Essential hypertension   AKI (acute kidney injury) ([place])   Traumatic closed displaced fracture of shaft of left humerus, initial encounter   Vitamin D deficiency   Advance diet Up with therapy  Dispo per medicine, to SNF likely tomorrow  Message with concerns of hand swelling.  I removed some of the Ace wraps, removed posterior splint, coaptation splint remained on and new Ace wraps applied to the  forearm and upper arm that were less tight to prevent as much swelling in the hand.  This amount of swelling is normal considering his injury.  Recommend on and off icing 30 minutes on 30 minutes off as well as pumping the hand and elevating the arm best that they can.  Weightbearing Status: NWB LUE ,   Follow-up in the office around last week of December, 1 week of January for repeat x-rays, transition to [place] 08/22/2022, 4:52 PM  Jonelle Sidle PA-C  Physician Assistant with Dr. Rogers, [place]

## 2022-08-22 NOTE — TOC Progression Note (Addendum)
Transition of Care [facility]) - Progression Note    Patient Details  Name: Jimmy Conner MRN: [id number] Date of Birth: Sep 15, 1945  Transition of Care [facility]) CM/SW Contact  Leeroy Cha, RN Phone Number: 08/22/2022, 10:08 AM  Clinical Narrative:    Adriana [place]as systemn for insurance auth, remains pending. Tcf-navihealth requesting updated pt notes sent via the hub. 1522-tct-[place] patient has been approved to come to [facility].  Will have a bed on [id number].  [place] notified.   Barriers to Discharge: Continued Medical Work up, SNF Pending bed offer, Orthoptist and Services In-house Referral: Clinical Social Work   Post Acute Care Choice: [place] Living arrangements for the past 2 months: Apartment                 DME Arranged: N/A DME Agency: NA                   Social Determinants of Health ([place]) Interventions [place]: No Food Insecurity (08/17/2022)  Housing: Low Risk  (08/17/2022)  Transportation Needs: No Transportation Needs (08/17/2022)  Utilities: Not At Risk (08/17/2022)  Tobacco Use: Low Risk  (08/17/2022)    Readmission Risk Interventions   No data to display

## 2022-08-22 NOTE — Progress Notes (Signed)
Occupational Therapy Treatment Patient Details Name: Jimmy Conner MRN: [id number] DOB: 12/01/1945 Today's Date: 08/22/2022   History of present illness Patient is 76 y.o. male presenting to [facility] ED on 12/14 secondary to a fall, found to have a comminuted L humerus fx.  PMH: prostate cancer, HTN, HIV, OA, anemia, colon cancer, L-THA 12/2019 with revision 02/2020.   OT comments  Patient noted to have increased edema in MCP area on LUE with cast forming pocket of fluid. Nurse made aware, nurse to bring up with MD. Patient was educated on keeping LUE elevated within restrictions. Patient verbalized understanding. Patient was noted to make progress with min A to transfer from edge of bed to commode in bathroom with increased cues for safety. Patient's discharge plan remains appropriate at this time. OT will continue to follow acutely.     Recommendations for follow up therapy are one component of a multi-disciplinary discharge planning process, led by the attending physician.  Recommendations may be updated based on patient status, additional functional criteria and insurance authorization.    Follow Up Recommendations  Skilled nursing-short term rehab (<3 hours/day)     Assistance Recommended at Discharge Frequent or constant Supervision/Assistance  Patient can return home with the following  A little help with walking and/or transfers;A lot of help with bathing/dressing/bathroom;Assistance with cooking/housework;Help with stairs or ramp for entrance   Equipment Recommendations  Other (comment) (defer to next venue)    Recommendations for Other Services      Precautions / Restrictions Precautions Precautions: Fall Precaution Comments: recent fall Required Braces or Orthoses: Splint/Cast Splint/Cast: splint from upper arm to wrist Restrictions Weight Bearing Restrictions: Yes LUE Weight Bearing: Non weight bearing       Mobility Bed Mobility Overal bed mobility: Needs Assistance Bed  Mobility: Supine to Sit, Sit to Supine     Supine to sit: Max assist Sit to supine: Max assist   General bed mobility comments: Max assist for upperbody with increased A for cues for sequencing of task    Transfers Overall transfer level: Needs assistance Equipment used: Quad cane Transfers: Sit to/from Stand Sit to Stand: Mod assist                 Balance                                           ADL either performed or assessed with clinical judgement   ADL Overall ADL's : Needs assistance/impaired                         Toilet Transfer: Minimal assistance;Regular Toilet;Ambulation Toilet Transfer Details (indicate cue type and reason): with cane with increased time and some unsteadiness when patient stopped to tell story unexpectidly with posterior leaning. min A to maintin standing balance.         Functional mobility during ADLs: Minimal assistance;Cane      Extremity/Trunk Assessment              Vision       Perception     Praxis      Cognition Arousal/Alertness: Awake/alert Behavior During Therapy: WFL for tasks assessed/performed Overall Cognitive Status: Within Functional Limits for tasks assessed  General Comments: patient is plesant and movitated to participate in session. noted to become SOB with increased talking during session        Exercises      Shoulder Instructions       General Comments      Pertinent Vitals/ Pain       Pain Assessment Pain Assessment: Faces Faces Pain Scale: Hurts a little bit Pain Location: left shoulder, left hip Pain Descriptors / Indicators: Discomfort, Grimacing, Guarding Pain Intervention(s): Limited activity within patient's tolerance, Monitored during session  Home Living                                          Prior Functioning/Environment              Frequency  Min 2X/week         Progress Toward Goals  OT Goals(current goals can now be found in the care plan section)  Progress towards OT goals: Progressing toward goals     Plan Discharge plan remains appropriate    Co-evaluation                 AM-PAC OT "6 Clicks" Daily Activity     Outcome Measure   Help from another person eating meals?: A Little Help from another person taking care of personal grooming?: A Little Help from another person toileting, which includes using toliet, bedpan, or urinal?: A Lot Help from another person bathing (including washing, rinsing, drying)?: A Lot Help from another person to put on and taking off regular upper body clothing?: A Lot Help from another person to put on and taking off regular lower body clothing?: A Lot 6 Click Score: 14    End of Session Equipment Utilized During Treatment: Gait belt;Other (comment) (cane)  OT Visit Diagnosis: Unsteadiness on feet (R26.81);Pain Pain - Right/Left: Left Pain - part of body: Shoulder;Arm;Hand   Activity Tolerance Patient limited by pain   Patient Left in chair;with call bell/phone within reach;with chair alarm set   Nurse Communication Mobility status;Patient requests pain meds        Time: 1519-1550 OT Time Calculation (min): 31 min  Charges: OT General Charges $OT Visit: 1 Visit OT Treatments $Therapeutic Activity: 23-37 mins  Rennie Plowman, MS Acute Rehabilitation Department Office# [phone]   Willa Rough 08/22/2022, 4:03 PM

## 2022-08-22 NOTE — Progress Notes (Signed)
PROGRESS NOTE    Jimmy Conner  VVO:[id number] DOB: 02/14/1946 DOA: 08/16/2022 PCP: [facility]   Brief Narrative:  11-year with history of osteopenia, HIV, HTN, history of cervical vertebral fracture in late childhood, prostate cancer who fell 2 days and was seen at [place] encounters?  He was seen by Dr. Stann Mainland at [facility] who referred him to ER for inability to be in his apartment by himself.  Patient was found to have spiral fracture of the left humerus and acute kidney injury.  Upon admission he was also noted to be in AKI.  Orthopedic recommended conservative management and outpatient follow-up.  PT OT recommended SNF, currently awaiting placement.   Assessment & Plan:  Principal Problem:   Fall at home, initial encounter Active Problems:   Traumatic closed displaced fracture of shaft of left humerus, initial encounter   Vitamin D deficiency   AKI (acute kidney injury) ([place])   Essential hypertension   HIV (human immunodeficiency virus infection) ([place])     Assessment and Plan: * Fall at home, initial encounter Sec due to generalized weakness.  PT/OT is recommending SNF.  TOC working on placement  Traumatic closed displaced fracture of shaft of left humerus, initial encounter Seen by orthopedic, [facility].  Recommending conservative management at this time.  Left arm is to be kept in the splint.  Pain control.  Vitamin D deficiency Vitamin D supplement started  AKI (acute kidney injury) ([place]) Baseline 0.9, admission creatinine 1.7.  Now resolved   Essential hypertension Continue Norvasc.  IV as needed   HIV (human immunodeficiency virus infection) ([place]) Continue home regimen of HAART therapy (Triumeq, Tenofovir) Continue home regimen of Bactrim    DVT prophylaxis: SCDs Start: 08/17/22 0718 Code Status: Full code Family Communication:    Status is: Inpatient Awaiting SNF placement Nutritional status     Subjective: Seen and examined at  bedside.  Sitting up in the recliner.  No complaints   Examination:  General exam: Appears calm and comfortable  Respiratory system: Clear to auscultation. Respiratory effort normal. Cardiovascular system: S1 & S2 heard, RRR. No JVD, murmurs, rubs, gallops or clicks. No pedal edema. Gastrointestinal system: Abdomen is nondistended, soft and nontender. No organomegaly or masses felt. Normal bowel sounds heard. Central nervous system: Alert and oriented. No focal neurological deficits. Extremities: Symmetric 5 x 5 power. Skin: No rashes, lesions or ulcers Psychiatry: Judgement and insight appear normal. Mood & affect appropriate.  Left arm currently in sling   Objective: Vitals:   08/21/22 0536 08/21/22 1342 08/21/22 2029 08/22/22 0611  BP: 132/75 115/76 123/76 129/68  Pulse: 87 93 91 85  Resp: '18 20 19 18  '$ Temp: 98.8 F (37.1 C) 98.4 F (36.9 C) 97.9 F (36.6 C) 98.6 F (37 C)  TempSrc: Oral Oral Oral Oral  SpO2: 95% 93% 95% 98%  Weight:      Height:        Intake/Output Summary (Last 24 hours) at 08/22/2022 0803 Last data filed at 08/22/2022 0618 Gross per 24 hour  Intake 240 ml  Output 1300 ml  Net -1060 ml   Filed Weights   08/16/22 1829 08/17/22 2035  Weight: 83.9 kg 86.9 kg     Data Reviewed:   CBC: Recent Labs  Lab 08/16/22 2222 08/17/22 0450 08/18/22 0638 08/19/22 0612 08/20/22 1052  WBC 8.2 6.8 7.3 7.2 7.1  NEUTROABS  --   --   --  5.5  --   HGB 10.6* 9.5* 9.0* 8.5* 9.1*  HCT 31.3* 28.0* 27.2* 25.3* 26.9*  MCV 95.4 96.6 97.8 96.9 95.7  PLT 192 150 146* 149* 712   Basic Metabolic Panel: Recent Labs  Lab 08/16/22 2222 08/17/22 0450 08/18/22 0638 08/19/22 0612  NA 142 144 140 138  K 3.4* 3.5 4.5 3.8  CL 108 109 107 104  CO2 '25 25 29 28  '$ GLUCOSE 111* 111* 112* 110*  BUN 29* '23 23 15  '$ CREATININE 1.72* 1.34* 1.10 0.91  CALCIUM 9.4 8.4* 8.2* 8.1*  MG  --   --   --  2.1   GFR: Estimated Creatinine Clearance: 76.4 mL/min (by C-G formula  based on SCr of 0.91 mg/dL). Liver Function Tests: Recent Labs  Lab 08/18/22 0638 08/19/22 0612  AST 53* 44*  ALT 52* 64*  ALKPHOS 64 69  BILITOT 0.4 0.6  PROT 6.0* 5.8*  ALBUMIN 3.1* 3.0*   No results for input(s): "LIPASE", "AMYLASE" in the last 168 hours. No results for input(s): "AMMONIA" in the last 168 hours. Coagulation Profile: No results for input(s): "INR", "PROTIME" in the last 168 hours. Cardiac Enzymes: Recent Labs  Lab 08/16/22 2222 08/17/22 0450 08/19/22 0639  CKTOTAL 412* 433* 253   BNP (last 3 results) No results for input(s): "PROBNP" in the last 8760 hours. HbA1C: No results for input(s): "HGBA1C" in the last 72 hours. CBG: No results for input(s): "GLUCAP" in the last 168 hours. Lipid Profile: No results for input(s): "CHOL", "HDL", "LDLCALC", "TRIG", "CHOLHDL", "LDLDIRECT" in the last 72 hours. Thyroid Function Tests: No results for input(s): "TSH", "T4TOTAL", "FREET4", "T3FREE", "THYROIDAB" in the last 72 hours. Anemia Panel: No results for input(s): "VITAMINB12", "FOLATE", "FERRITIN", "TIBC", "IRON", "RETICCTPCT" in the last 72 hours. Sepsis Labs: No results for input(s): "PROCALCITON", "LATICACIDVEN" in the last 168 hours.  No results found for this or any previous visit (from the past 240 hour(s)).       Radiology Studies: No results found.      Scheduled Meds:  abacavir-dolutegravir-lamiVUDine  1 tablet Oral Daily   amLODipine  5 mg Oral Daily   cholecalciferol  2,000 Units Oral Daily   docusate sodium  100 mg Oral BID   magnesium oxide  400 mg Oral Daily   sulfamethoxazole-trimethoprim  1 tablet Oral Q M,W,F   Tenofovir Alafenamide Fumarate  25 mg Oral Daily   Vitamin D (Ergocalciferol)  50,000 Units Oral Q7 days   Continuous Infusions:   LOS: 5 days   Time spent= 35 mins    Hoa Deriso Arsenio Loader, MD [facility]  If 7PM-7AM, please contact night-coverage  08/22/2022, 8:03 AM

## 2022-08-22 NOTE — Discharge Instructions (Addendum)
Orthopedic discharge instructions:   Patient has a displaced left humeral shaft fracture.   Weightbearing: Nonweightbearing to the left upper extremity.  Maintain sling, can do gentle elbow range of motion, should be doing hand pumps, off-and-on icing as needed for swelling of the hand.  Some swelling is normal.  Try to elevate the left arm.  Follow-up in the office at [facility] in 1 to 2 weeks (Between 12/27 - 1/4)  for repeat x-rays and likely transition to a Sarmiento brace.  Plan for conservative treatment at this time.  Call [phone] to make an appointment.

## 2022-08-23 DIAGNOSIS — I10 Essential (primary) hypertension: Secondary | ICD-10-CM | POA: Diagnosis not present

## 2022-08-23 DIAGNOSIS — Y92009 Unspecified place in unspecified non-institutional (private) residence as the place of occurrence of the external cause: Secondary | ICD-10-CM | POA: Diagnosis not present

## 2022-08-23 DIAGNOSIS — W19XXXA Unspecified fall, initial encounter: Secondary | ICD-10-CM | POA: Diagnosis not present

## 2022-08-23 MED ORDER — HYDROMORPHONE HCL 2 MG PO TABS: 1.0000 mg | ORAL_TABLET | ORAL | 0 refills | Status: DC | PRN

## 2022-08-23 MED ORDER — VITAMIN D (ERGOCALCIFEROL) 1.25 MG (50000 UNIT) PO CAPS: 50000.0000 [IU] | ORAL_CAPSULE | ORAL | Status: AC

## 2022-08-23 NOTE — Progress Notes (Signed)
Patient discharged to [facility].  Report called ([phone]) and I got transferred to 4 different places and put on hold until I hang up.

## 2022-08-23 NOTE — TOC Progression Note (Addendum)
Transition of Care [facility]) - Progression Note    Patient Details  Name: Jimmy Conner MRN: [id number] Date of Birth: Sep 26, 1945  Transition of Care [facility]) CM/SW Contact  Leeroy Cha, RN Phone Number: 08/23/2022, 8:21 AM  Clinical Narrative:    [id number] with wife is in favor of patient going to [facility].  Explained that they are suppose to have a bed today.  Will f/u with Lorenza Chick at [facility]. [id number] message left to call back with bed.    Barriers to Discharge: Continued Medical Work up, SNF Pending bed offer, Ship broker  Expected Discharge Plan and Services In-house Referral: Clinical Social Work   Post Acute Care Choice: [place] Living arrangements for the past 2 months: Apartment Expected Discharge Date: 08/23/22               DME Arranged: N/A DME Agency: NA                   Social Determinants of Health ([place]) Interventions SDOH Screenings   Food Insecurity: No Food Insecurity (08/17/2022)  Housing: Low Risk  (08/17/2022)  Transportation Needs: No Transportation Needs (08/17/2022)  Utilities: Not At Risk (08/17/2022)  Tobacco Use: Low Risk  (08/17/2022)    Readmission Risk Interventions   No data to display

## 2022-08-23 NOTE — Discharge Summary (Signed)
Physician Discharge Summary  Jimmy Conner RWE:[id number] DOB: 05-15-1946 DOA: 08/16/2022  PCP: [facility]  Admit date: 08/16/2022 Discharge date: 08/23/2022  Admitted From: Home Disposition:  SNF  Recommendations for Outpatient Follow-up:  Follow up with PCP in 1-2 weeks Please obtain BMP/CBC in 3-5 days Pain medication with bowel regimen prescribed Vitamin D 50,000 unit weekly for 1 month thereafter it can be changed to daily Adjust outpatient blood pressure medications as necessary  Recommend on and off icing 30 minutes on 30 minutes off as well as pumping the hand and elevating the arm best that they can. Weightbearing Status: NWB LUE , Follow-up with [facility] in the office around last week of December, 1 week of January for repeat x-rays, transition to Sarmiento splint   Discharge Condition: Stable CODE STATUS: Full code Diet recommendation: 2 g salt  Brief/Interim Summary: 76-year with history of osteopenia, HIV, HTN, history of cervical vertebral fracture in late childhood, prostate cancer who fell 2 days and was seen at [place] encounters?  He was seen by Dr. Stann Mainland at [facility] who referred him to ER for inability to be in his apartment by himself.  Patient was found to have spiral fracture of the left humerus and acute kidney injury.  Upon admission he was also noted to be in AKI.  Orthopedic recommended conservative management and outpatient follow-up.  PT OT recommended SNF, therefore arrangements were made. Day prior to his discharge, he was seen by orthopedic and his left shoulder dressing and splint was addressed and a new Ace wrap was applied to his forearm and upper arm.  Recommendations as mentioned above   Assessment and Plan: * Fall at home, initial encounter Sec due to generalized weakness.  PT/OT is recommending SNF.     Traumatic closed displaced fracture of shaft of left humerus, initial encounter Seen by orthopedic, [facility].   Recommending conservative management at this time.  Left arm is now in the Ace wrap.  Recommending on and off icing for 30 minutes, pumping the hand and elevating as best as possible.  Nonweightbearing of the left upper extremity.  Outpatient follow-up with orthopedic   Vitamin D deficiency Vitamin D supplement started weekly for next month thereafter he can be changed daily.   AKI (acute kidney injury) ([place]) Baseline 0.9, admission creatinine 1.7.  Now resolved     Essential hypertension Resume home meds     HIV (human immunodeficiency virus infection) ([place]) Continue home regimen of HAART therapy (Triumeq, Tenofovir) Continue home regimen of Bactrim       Discharge Diagnoses:  Principal Problem:   Fall at home, initial encounter Active Problems:   Traumatic closed displaced fracture of shaft of left humerus, initial encounter   Vitamin D deficiency   AKI (acute kidney injury) ([place])   Essential hypertension   HIV (human immunodeficiency virus infection) ([place])      Consultations: Orthopedic  Subjective: Doing well no complaints  Discharge Exam: Vitals:   08/22/22 2005 08/23/22 0559  BP: 134/76 124/74  Pulse: 89 81  Resp: 18 18  Temp: 98.3 F (36.8 C) 99.1 F (37.3 C)  SpO2: 100% 97%   Vitals:   08/22/22 0611 08/22/22 1247 08/22/22 2005 08/23/22 0559  BP: 129/68 113/73 134/76 124/74  Pulse: 85 94 89 81  Resp: '18 18 18 18  '$ Temp: 98.6 F (37 C) (!) 97.4 F (36.3 C) 98.3 F (36.8 C) 99.1 F (37.3 C)  TempSrc: Oral Oral Oral Oral  SpO2: 98% 100% 100%  97%  Weight:      Height:        General: Pt is alert, awake, not in acute distress Cardiovascular: RRR, S1/S2 +, no rubs, no gallops Respiratory: CTA bilaterally, no wheezing, no rhonchi Abdominal: Soft, NT, ND, bowel sounds + Extremities: no edema, no cyanosis  Discharge Instructions   Allergies as of 08/23/2022       Reactions   Hydrocodone-acetaminophen Itching   Pt can take tylenol    Oxycodone Hcl    Makes skin peel        Medication List     STOP taking these medications    ibuprofen 200 MG tablet Commonly known as: ADVIL   methocarbamol 500 MG tablet Commonly known as: Robaxin   traMADol 50 MG tablet Commonly known as: Ultram       TAKE these medications    acetaminophen 500 MG tablet Commonly known as: TYLENOL Take 500 mg by mouth every 6 (six) hours as needed for moderate pain.   amLODipine 10 MG tablet Commonly known as: NORVASC Take 10 mg by mouth daily.   CALCIUM PO Take 1 tablet by mouth daily at 6 (six) AM.   docusate sodium 100 MG capsule Commonly known as: Colace Take 1 capsule (100 mg total) by mouth 2 (two) times daily. What changed:  when to take this reasons to take this   HYDROmorphone 2 MG tablet Commonly known as: DILAUDID Take 0.5-1 tablets (1-2 mg total) by mouth every 4 (four) hours as needed for moderate pain or severe pain.   losartan 100 MG tablet Commonly known as: COZAAR Take 100 mg by mouth every evening.   sulfamethoxazole-trimethoprim 800-160 MG tablet Commonly known as: BACTRIM DS Take 1 tablet by mouth every Monday, Wednesday, and Friday.   tamsulosin 0.4 MG Caps capsule Commonly known as: FLOMAX Take 0.4 mg by mouth every evening.   Tenofovir Alafenamide Fumarate 25 MG Tabs Take 25 mg by mouth daily.   Triumeq 600-50-300 MG tablet Generic drug: abacavir-dolutegravir-lamiVUDine Take 1 tablet by mouth daily.   Vitamin D (Ergocalciferol) 1.25 MG (50000 UNIT) Caps capsule Commonly known as: DRISDOL Take 1 capsule (50,000 Units total) by mouth every 7 (seven) days. Start taking on: August 27, 2022   Vitamin D 50 MCG (2000 UT) Caps Take 2,000 Units by mouth daily.        Follow-up Information     Nicholes Stairs, MD Follow up on 08/30/2022.   Specialty: Orthopedic Surgery Why: Call to make appointment and with  any Concerns or questions Contact information: [address] [place] [address] [phone]         Clinic, [place] Follow up in 1 week(s).   Contact information: [place] [address] [phone]                Allergies  Allergen Reactions   Hydrocodone-Acetaminophen Itching    Pt can take tylenol   Oxycodone Hcl     Makes skin peel    You were cared for by a hospitalist during your hospital stay. If you have any questions about your discharge medications or the care you received while you were in the hospital after you are discharged, you can call the unit and asked to speak with the hospitalist on call if the hospitalist that took care of you is not available. Once you are discharged, your primary care physician will handle any further medical issues. Please note that no refills for  any discharge medications will be authorized once you are discharged, as it is imperative that you return to your primary care physician (or establish a relationship with a primary care physician if you do not have one) for your aftercare needs so that they can reassess your need for medications and monitor your lab values.   Procedures/Studies: DG CHEST PORT 1 VIEW  Result Date: 08/17/2022 CLINICAL DATA:  Preop cardiovascular exam EXAM: PORTABLE CHEST 1 VIEW COMPARISON:  Chest radiograph 03/03/2018 FINDINGS: The cardiomediastinal silhouette is unchanged. There is no focal airspace consolidation. There is no pleural effusion or evidence of pneumothorax. There is left peripheral basilar subsegmental atelectasis. There is no acute osseous abnormality. IMPRESSION: No evidence of acute cardiopulmonary disease. Electronically Signed   By: Maurine Simmering M.D.   On: 08/17/2022 10:12   DG Humerus Left  Result Date: 08/16/2022 CLINICAL DATA:  Left humeral fracture yesterday diagnosed elsewhere, sent here with increasing pain. EXAM: LEFT HUMERUS - 2+ VIEW COMPARISON:  None Available. FINDINGS:  Osteopenia. There is a roughly spiral fracture along the humeral shaft, proximal to midportion, with a large butterfly comminution fragment at the lateral aspect of the fracture. The butterfly fragment contains portions of the proximal, mid and distal shaft is distracted laterally by about 1/2 of the shaft width. The main distal fragment shows mild dorsal and lateral angulation, without other significant displacement relative to the proximal main fragment. There is overlying casting material. Mild features of degenerative arthrosis involving the [facility] joint without dislocation. The humeral head and elbow are normally located. No displaced fracture seen in the visualized left ribs. IMPRESSION: Osteopenia with a roughly spiral fracture of the humeral shaft, with lateral displacement by 1/2 shaft width of a large butterfly comminution fragment at the lateral aspect of the fracture. Mild dorsal and lateral angulation noted of the main distal fragment. Electronically Signed   By: Telford Nab M.D.   On: 08/16/2022 21:53     The results of significant diagnostics from this hospitalization (including imaging, microbiology, ancillary and laboratory) are listed below for reference.     Microbiology: No results found for this or any previous visit (from the past 240 hour(s)).   Labs: BNP (last 3 results) No results for input(s): "BNP" in the last 8760 hours. Basic Metabolic Panel: Recent Labs  Lab 08/16/22 2222 08/17/22 0450 08/18/22 0638 08/19/22 0612 08/22/22 0838  NA 142 144 140 138 139  K 3.4* 3.5 4.5 3.8 3.8  CL 108 109 107 104 105  CO2 '25 25 29 28 26  '$ GLUCOSE 111* 111* 112* 110* 105*  BUN 29* '23 23 15 23  '$ CREATININE 1.72* 1.34* 1.10 0.91 1.06  CALCIUM 9.4 8.4* 8.2* 8.1* 8.2*  MG  --   --   --  2.1 2.6*   Liver Function Tests: Recent Labs  Lab 08/18/22 0638 08/19/22 0612  AST 53* 44*  ALT 52* 64*  ALKPHOS 64 69  BILITOT 0.4 0.6  PROT 6.0* 5.8*  ALBUMIN 3.1* 3.0*   No results for  input(s): "LIPASE", "AMYLASE" in the last 168 hours. No results for input(s): "AMMONIA" in the last 168 hours. CBC: Recent Labs  Lab 08/17/22 0450 08/18/22 0638 08/19/22 0612 08/20/22 1052 08/22/22 0838  WBC 6.8 7.3 7.2 7.1 6.4  NEUTROABS  --   --  5.5  --   --   HGB 9.5* 9.0* 8.5* 9.1* 8.8*  HCT 28.0* 27.2* 25.3* 26.9* 27.0*  MCV 96.6 97.8 96.9 95.7 97.5  PLT 150 146* 149*  202 232   Cardiac Enzymes: Recent Labs  Lab 08/16/22 2222 08/17/22 0450 08/19/22 0639  CKTOTAL 412* 433* 253   BNP: Invalid input(s): "POCBNP" CBG: No results for input(s): "GLUCAP" in the last 168 hours. D-Dimer No results for input(s): "DDIMER" in the last 72 hours. Hgb A1c No results for input(s): "HGBA1C" in the last 72 hours. Lipid Profile No results for input(s): "CHOL", "HDL", "LDLCALC", "TRIG", "CHOLHDL", "LDLDIRECT" in the last 72 hours. Thyroid function studies No results for input(s): "TSH", "T4TOTAL", "T3FREE", "THYROIDAB" in the last 72 hours.  Invalid input(s): "FREET3" Anemia work up No results for input(s): "VITAMINB12", "FOLATE", "FERRITIN", "TIBC", "IRON", "RETICCTPCT" in the last 72 hours. Urinalysis    Component Value Date/Time   COLORURINE YELLOW (A) 03/03/2018 1821   APPEARANCEUR HAZY (A) 03/03/2018 1821   LABSPEC 1.021 03/03/2018 1821   PHURINE 6.0 03/03/2018 1821   GLUCOSEU NEGATIVE 03/03/2018 1821   HGBUR MODERATE (A) 03/03/2018 1821   BILIRUBINUR NEGATIVE 03/03/2018 1821   KETONESUR NEGATIVE 03/03/2018 1821   PROTEINUR 100 (A) 03/03/2018 1821   NITRITE NEGATIVE 03/03/2018 1821   LEUKOCYTESUR MODERATE (A) 03/03/2018 1821   Sepsis Labs Recent Labs  Lab 08/18/22 0638 08/19/22 0612 08/20/22 1052 08/22/22 0838  WBC 7.3 7.2 7.1 6.4   Microbiology No results found for this or any previous visit (from the past 240 hour(s)).   Time coordinating discharge:  I have spent 35 minutes face to face with the patient and on the ward discussing the patients care,  assessment, plan and disposition with other care givers. >50% of the time was devoted counseling the patient about the risks and benefits of treatment/Discharge disposition and coordinating care.   SIGNED:   Damita Lack, MD  [facility] 08/23/2022, 8:15 AM   If 7PM-7AM, please contact night-coverage

## 2022-08-23 NOTE — TOC Transition Note (Signed)
Transition of Care [facility]) - CM/SW Discharge Note   Patient Details  Name: Jimmy Conner MRN: [id number] Date of Birth: 22-Aug-1946  Transition of Care [facility]) CM/SW Contact:  Leeroy Cha, RN Phone Number: 08/23/2022, 10:21 AM   Clinical Narrative:    Ptar called at 1015 for traNSPORT TO [place].  Tct-wife informed her of transport. Transport packet to the nursing station.   Final next level of care: Skilled Nursing Facility Barriers to Discharge: Barriers Resolved   Patient Goals and CMS Choice Patient states their goals for this hospitalization and ongoing recovery are:: Go to short-term rehab before returning home CMS Medicare.gov Compare Post Acute Care list provided to:: Patient Choice offered to / list presented to : Patient    Discharge Placement                       Discharge Plan and Services In-house Referral: Clinical Social Work   Post Acute Care Choice: [place]          DME Arranged: N/A DME Agency: NA                  Social Determinants of Health ([place]) Interventions     Readmission Risk Interventions   No data to display

## 2022-09-18 ENCOUNTER — Other Ambulatory Visit (HOSPITAL_COMMUNITY): Payer: Self-pay | Admitting: Orthopedic Surgery

## 2022-09-18 DIAGNOSIS — M79602 Pain in left arm: Secondary | ICD-10-CM

## 2022-09-18 DIAGNOSIS — M7989 Other specified soft tissue disorders: Secondary | ICD-10-CM

## 2022-09-19 ENCOUNTER — Other Ambulatory Visit (HOSPITAL_COMMUNITY): Payer: Self-pay

## 2022-09-19 ENCOUNTER — Ambulatory Visit (HOSPITAL_COMMUNITY): Payer: Medicare PPO

## 2022-09-19 ENCOUNTER — Ambulatory Visit (HOSPITAL_COMMUNITY)
Admission: RE | Admit: 2022-09-19 | Discharge: 2022-09-19 | Disposition: A | Discharge status: Home / Self Care | Payer: Medicare PPO | Source: Ambulatory Visit | Attending: Cardiovascular Disease | Admitting: Cardiovascular Disease

## 2022-09-19 ENCOUNTER — Ambulatory Visit (HOSPITAL_BASED_OUTPATIENT_CLINIC_OR_DEPARTMENT_OTHER)
Admission: RE | Admit: 2022-09-19 | Discharge: 2022-09-19 | Disposition: A | Discharge status: Home / Self Care | Payer: Medicare PPO | Source: Ambulatory Visit

## 2022-09-19 VITALS — BP 128/88 | HR 92

## 2022-09-19 DIAGNOSIS — I82622 Acute embolism and thrombosis of deep veins of left upper extremity: Secondary | ICD-10-CM | POA: Insufficient documentation

## 2022-09-19 DIAGNOSIS — M79602 Pain in left arm: Secondary | ICD-10-CM | POA: Insufficient documentation

## 2022-09-19 MED ORDER — APIXABAN 5 MG PO TABS: 5.0000 mg | ORAL_TABLET | Freq: Two times a day (BID) | ORAL | 1 refills | Status: DC

## 2022-09-19 NOTE — Patient Instructions (Addendum)
-  I have sent refills for Eliquis 5 mg twice daily to your [facility]. You will likely need to call the pharmacy to ask them to fill this when you start to run low on your current supply. Call me if there is going to be any gap in getting this from the pharmacy in your switch from [place] to [place].  -It is important to take your medication around the same time every day.  -Avoid NSAIDs like ibuprofen (Advil, Motrin) and naproxen (Aleve) as well as aspirin doses over 100 mg daily. -Tylenol (acetaminophen) is the preferred over the counter pain medication to lower the risk of bleeding. -Be sure to alert all of your health care providers that you are taking an anticoagulant prior to starting a new medication or having a procedure. -Monitor for signs and symptoms of bleeding (abnormal bruising, prolonged bleeding, nose bleeds, bleeding from gums, discolored urine, black tarry stools). If you have fallen and hit your head OR if your bleeding is severe or not stopping, seek emergency care.  -Go to the emergency room if emergent signs and symptoms of new clot occur (new or worse swelling and pain in an arm or leg, shortness of breath, chest pain, fast or irregular heartbeats, lightheadedness, dizziness, fainting, coughing up blood) or if you experience a significant color change (pale or blue) in the extremity that has the DVT.   Your next visit is on February 13th at 2pm. Please bring lab results from the [place] to this visit.  [facility] [address], [place], [place] [address] Enter the hospital through [address]trance [address] off [address] and pull up to the [place] entrance to the free [place] parking.  Check in for your appointment at the [place].   If you have any questions or need to reschedule an appointment, please call [phone].  If you are having an emergency, call 911 or present to the nearest emergency room.   What is a DVT?  -Deep  vein thrombosis (DVT) is a condition in which a blood clot forms in a vein of the deep venous system which can occur in the lower leg, thigh, pelvis, arm, or neck. This condition is serious and can be life-threatening if the clot travels to the arteries of the lungs and causing a blockage (pulmonary embolism, PE). A DVT can also damage veins in the leg, which can lead to long-term venous disease, leg pain, swelling, discoloration, and ulcers or sores (post-thrombotic syndrome).  -Treatment may include taking an anticoagulant medication to prevent more clots from forming and the current clot from growing, wearing compression stockings, and/or surgical procedures to remove or dissolve the clot.
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
## 2022-10-16 ENCOUNTER — Ambulatory Visit (HOSPITAL_COMMUNITY): Payer: Medicare PPO

## 2022-10-31 ENCOUNTER — Ambulatory Visit (HOSPITAL_COMMUNITY)
Admission: RE | Admit: 2022-10-31 | Discharge: 2022-10-31 | Disposition: A | Discharge status: Home / Self Care | Payer: Medicare PPO | Source: Ambulatory Visit | Attending: Surgery | Admitting: Surgery

## 2022-10-31 ENCOUNTER — Encounter (HOSPITAL_COMMUNITY): Payer: Self-pay

## 2022-10-31 VITALS — BP 146/81 | HR 108

## 2022-10-31 DIAGNOSIS — I82622 Acute embolism and thrombosis of deep veins of left upper extremity: Secondary | ICD-10-CM | POA: Diagnosis not present
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
## 2022-10-31 NOTE — Patient Instructions (Signed)
-  Continue Eliquis 5 mg (1 tablet) twice daily to complete 3 months.  -Your refills have been sent to [facility]. You will likely need to call the pharmacy to ask them to fill this when you start to run low on your current supply.  -It is important to take your medication around the same time every day.  -Avoid NSAIDs like ibuprofen (Advil, Motrin) and naproxen (Aleve) as well as aspirin doses over 100 mg daily. -Tylenol (acetaminophen) is the preferred over the counter pain medication to lower the risk of bleeding. -Be sure to alert all of your health care providers that you are taking an anticoagulant prior to starting a new medication or having a procedure. -Monitor for signs and symptoms of bleeding (abnormal bruising, prolonged bleeding, nose bleeds, bleeding from gums, discolored urine, black tarry stools). If you have fallen and hit your head OR if your bleeding is severe or not stopping, seek emergency care.  -Go to the emergency room if emergent signs and symptoms of new clot occur (new or worse swelling and pain in an arm or leg, shortness of breath, chest pain, fast or irregular heartbeats, lightheadedness, dizziness, fainting, coughing up blood) or if you experience a significant color change (pale or blue) in the extremity that has the DVT.   If you have any questions or need to reschedule an appointment, please call [phone] [facility].  If you are having an emergency, call 911 or present to the nearest emergency room.   What is a DVT?  -Deep vein thrombosis (DVT) is a condition in which a blood clot forms in a vein of the deep venous system which can occur in the lower leg, thigh, pelvis, arm, or neck. This condition is serious and can be life-threatening if the clot travels to the arteries of the lungs and causing a blockage (pulmonary embolism, PE). A DVT can also damage veins in the leg, which can lead to long-term venous disease, leg pain, swelling, discoloration, and ulcers or  sores (post-thrombotic syndrome).  -Treatment may include taking an anticoagulant medication to prevent more clots from forming and the current clot from growing, wearing compression stockings, and/or surgical procedures to remove or dissolve the clot.

## 2022-12-13 ENCOUNTER — Telehealth (HOSPITAL_COMMUNITY): Payer: Self-pay | Admitting: Student-PharmD

## 2022-12-13 NOTE — Telephone Encounter (Signed)
Received a call from [place] at [facility] in [facility] where the patient is a resident. She wanted to confirm whether the patient was supposed to discontinue Eliquis. The plan was for the patient to take Eliquis for 3 months for a provoked DVT, start date was 09/16/22, so if he has finished 3 month supply he can discontinue. They need an order stating the patient should discontinue. She will fax over an order for [place] to sign and fax back. I will also discontinue the medication from his list in our system since he has completed 3 months of treatment.

## 2024-01-29 ENCOUNTER — Encounter (HOSPITAL_COMMUNITY)

## 2024-02-06 ENCOUNTER — Inpatient Hospital Stay (HOSPITAL_COMMUNITY): Admit: 2024-02-06 | Admitting: Orthopedic Surgery

## 2024-02-06 SURGERY — REVISION, ARTHROPLASTY, HIP
Anesthesia: Spinal | Site: Hip | Laterality: Left
